# Patient Record
Sex: Male | Born: 1980 | Race: White | Hispanic: No | Marital: Single | Smoking: Never smoker
Health system: Southern US, Community
[De-identification: ages and names within clinical notes are randomized; demographics above are authoritative.]

## PROBLEM LIST (undated history)

## (undated) DIAGNOSIS — I881 Chronic lymphadenitis, except mesenteric: Secondary | ICD-10-CM

## (undated) DIAGNOSIS — F419 Anxiety disorder, unspecified: Secondary | ICD-10-CM

## (undated) DIAGNOSIS — K700 Alcoholic fatty liver: Secondary | ICD-10-CM

## (undated) DIAGNOSIS — E781 Pure hyperglyceridemia: Secondary | ICD-10-CM

## (undated) HISTORY — DX: Alcoholic fatty liver: K70.0

## (undated) HISTORY — DX: Anxiety disorder, unspecified: F41.9

## (undated) HISTORY — DX: Pure hyperglyceridemia: E78.1

## (undated) HISTORY — PX: VASECTOMY: SHX75

---
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
## 2015-03-24 ENCOUNTER — Encounter: Payer: Self-pay | Admitting: Family Medicine

## 2015-03-24 ENCOUNTER — Ambulatory Visit (INDEPENDENT_AMBULATORY_CARE_PROVIDER_SITE_OTHER): Payer: BLUE CROSS/BLUE SHIELD | Admitting: Family Medicine

## 2015-03-24 VITALS — BP 118/70 | HR 79 | Ht 77.0 in | Wt 226.0 lb

## 2015-03-24 DIAGNOSIS — F411 Generalized anxiety disorder: Secondary | ICD-10-CM

## 2015-03-24 DIAGNOSIS — M25562 Pain in left knee: Secondary | ICD-10-CM | POA: Diagnosis not present

## 2015-03-24 DIAGNOSIS — Z202 Contact with and (suspected) exposure to infections with a predominantly sexual mode of transmission: Secondary | ICD-10-CM

## 2015-03-24 MED ORDER — VENLAFAXINE HCL ER 75 MG PO CP24: ORAL_CAPSULE | ORAL | Status: DC

## 2015-03-24 NOTE — Progress Notes (Signed)
CC: Eduardo BlazerJoseph Parker is a 34 y.o. male is here for Establish Care   Subjective: HPI:  Very pleasant 34 year old here to establish care  Complains of left knee pain that is currently absent however if he is inactive and does not go to the gym most days a week he'll have left knee pain localized beneath the kneecap. It's worse with sitting for long periods of time. It's worse when descending stairs. Other than exercising nothing seems to make it better. Pain is mild in severity when present. He denies any mechanical symptoms. He denies swelling or redness of the knee  Complains of a sense of anxiety most days of the week. He describes it as a "universal" sense of worrying. He also expresses an element of depression however he has difficulty further defining it. Symptoms are mild in severity with interferon quality of life. No thoughts of wanting to harm self or others. He's been on Xanax in the past but no other medication for psychiatric issues. Symptoms have been present at least for 3 months now.  He has a history of unprotected sex with partners that could have had an STD. He denies any penile discharge or any other symptoms but he is worried that he could possibly have an STD and could spread to others.  Review of Systems - General ROS: negative for - chills, fever, night sweats, weight gain or weight loss Ophthalmic ROS: negative for - decreased vision ENT ROS: negative for - hearing change, nasal congestion, tinnitus or allergies Hematological and Lymphatic ROS: negative for - bleeding problems, bruising or swollen lymph nodes Breast ROS: negative Respiratory ROS: no cough, shortness of breath, or wheezing Cardiovascular ROS: no chest pain or dyspnea on exertion Gastrointestinal ROS: no abdominal pain, change in bowel habits, or black or bloody stools Genito-Urinary ROS: negative for - genital discharge, genital ulcers, incontinence or abnormal bleeding from genitals Musculoskeletal ROS:  negative for - joint pain or muscle pain other than that described above Neurological ROS: negative for - headaches or memory loss Dermatological ROS: negative for lumps, mole changes, rash and skin lesion changes  History reviewed. No pertinent past medical history.  History reviewed. No pertinent past surgical history. Family History  Problem Relation Age of Onset  . Breast cancer Mother   . Depression Mother   . Depression Father     Social History   Social History  . Marital Status: Single    Spouse Name: N/A  . Number of Children: N/A  . Years of Education: N/A   Occupational History  . Not on file.   Social History Main Topics  . Smoking status: Never Smoker   . Smokeless tobacco: Never Used  . Alcohol Use: Yes  . Drug Use: No  . Sexual Activity: Yes    Birth Control/ Protection: None   Other Topics Concern  . Not on file   Social History Narrative  . No narrative on file     Objective: BP 118/70 mmHg  Pulse 79  Ht 6\' 5"  (1.956 m)  Wt 226 lb (102.513 kg)  BMI 26.79 kg/m2  General: Alert and Oriented, No Acute Distress HEENT: Pupils equal, round, reactive to light. Conjunctivae clear.  External ears unremarkable, canals clear with intact TMs with appropriate landmarks.  Middle ear appears open without effusion. Pink inferior turbinates.  Moist mucous membranes, pharynx without inflammation nor lesions.  Neck supple without palpable lymphadenopathy nor abnormal masses. Lungs: Clear to auscultation bilaterally, no wheezing/ronchi/rales.  Comfortable work of breathing.  Good air movement. Cardiac: Regular rate and rhythm. Normal S1/S2.  No murmurs, rubs, nor gallops.   Extremities: No peripheral edema.  Strong peripheral pulses.  Mental Status: No depression, anxiety, nor agitation. Skin: Warm and dry.  Assessment & Plan: Neizan was seen today for establish care.  Diagnoses and all orders for this visit:  Generalized anxiety disorder -     venlafaxine XR  (EFFEXOR-XR) 75 MG 24 hr capsule; One by mouth daily, increase to two every morning after one week if tolerated.  Left knee pain  STD exposure -     HIV antibody -     RPR -     Hepatitis C antibody -     GC/Chlamydia Probe Amp   Anxiety: Start Effexor, follow-up in 4 weeks. Left knee pain: Given home exercises and rehabilitation plan to start for the next 3 weeks. STD exposure: Screening labs above, avoid unprotected sex pending results.  Return in about 4 weeks (around 04/21/2015) for CPE.

## 2015-03-25 LAB — RPR

## 2015-03-25 LAB — HIV ANTIBODY (ROUTINE TESTING W REFLEX): HIV 1&2 Ab, 4th Generation: NONREACTIVE

## 2015-03-25 LAB — GC/CHLAMYDIA PROBE AMP
CT Probe RNA: NEGATIVE
GC Probe RNA: NEGATIVE

## 2015-03-25 LAB — HEPATITIS C ANTIBODY: HCV Ab: NEGATIVE

## 2015-03-25 NOTE — Progress Notes (Signed)
Pt.notified

## 2015-04-28 ENCOUNTER — Ambulatory Visit (INDEPENDENT_AMBULATORY_CARE_PROVIDER_SITE_OTHER): Payer: BLUE CROSS/BLUE SHIELD | Admitting: Family Medicine

## 2015-04-28 ENCOUNTER — Encounter: Payer: Self-pay | Admitting: Family Medicine

## 2015-04-28 VITALS — BP 122/69 | HR 78 | Wt 225.0 lb

## 2015-04-28 DIAGNOSIS — Z0000 Encounter for general adult medical examination without abnormal findings: Secondary | ICD-10-CM | POA: Diagnosis not present

## 2015-04-28 DIAGNOSIS — F411 Generalized anxiety disorder: Secondary | ICD-10-CM

## 2015-04-28 DIAGNOSIS — R1909 Other intra-abdominal and pelvic swelling, mass and lump: Secondary | ICD-10-CM | POA: Diagnosis not present

## 2015-04-28 LAB — COMPLETE METABOLIC PANEL WITH GFR
ALBUMIN: 4.5 g/dL (ref 3.6–5.1)
ALK PHOS: 68 U/L (ref 40–115)
ALT: 22 U/L (ref 9–46)
AST: 22 U/L (ref 10–40)
BILIRUBIN TOTAL: 0.8 mg/dL (ref 0.2–1.2)
BUN: 12 mg/dL (ref 7–25)
CO2: 29 mmol/L (ref 20–31)
Calcium: 9.7 mg/dL (ref 8.6–10.3)
Chloride: 102 mmol/L (ref 98–110)
Creat: 1.13 mg/dL (ref 0.60–1.35)
GFR, Est African American: >89 mL/min (ref >=60)
GFR, Est Non African American: 83 mL/min (ref >=60)
GLUCOSE: 73 mg/dL (ref 65–99)
Potassium: 4 mmol/L (ref 3.5–5.3)
SODIUM: 138 mmol/L (ref 135–146)
TOTAL PROTEIN: 6.9 g/dL (ref 6.1–8.1)

## 2015-04-28 LAB — CBC
HCT: 49.2 % (ref 39.0–52.0)
HEMOGLOBIN: 16.9 g/dL (ref 13.0–17.0)
MCH: 30.3 pg (ref 26.0–34.0)
MCHC: 34.3 g/dL (ref 30.0–36.0)
MCV: 88.2 fL (ref 78.0–100.0)
MPV: 9.9 fL (ref 8.6–12.4)
PLATELETS: 241 10*3/uL (ref 150–400)
RBC: 5.58 MIL/uL (ref 4.22–5.81)
RDW: 12.9 % (ref 11.5–15.5)
WBC: 4.7 10*3/uL (ref 4.0–10.5)

## 2015-04-28 LAB — LIPID PANEL
Cholesterol: 152 mg/dL (ref 125–200)
HDL: 32 mg/dL — ABNORMAL LOW (ref >=40)
LDL Cholesterol: 72 mg/dL (ref <130)
Total CHOL/HDL Ratio: 4.8 Ratio (ref <=5.0)
Triglycerides: 241 mg/dL — ABNORMAL HIGH (ref <150)
VLDL: 48 mg/dL — ABNORMAL HIGH (ref <30)

## 2015-04-28 MED ORDER — VENLAFAXINE HCL ER 75 MG PO CP24: ORAL_CAPSULE | ORAL | Status: DC

## 2015-04-28 NOTE — Progress Notes (Signed)
CC: Laurine BlazerJoseph Parker is a 35 y.o. male is here for Annual Exam   Subjective: HPI:  Colonoscopy: No current indication Prostate: no current indication   Influenza Vaccine: declined today Pneumovax: no current indication Td/Tdap: Needs Tdap today Zoster: (Start 35 yo)  Requesting CPE with painless non changing mass in right groin noticed three weeks ago.  Nothing makes better or worse.  Review of Systems - General ROS: negative for - chills, fever, night sweats, weight gain or weight loss Ophthalmic ROS: negative for - decreased vision Psychological ROS: negative for - anxiety or depression ENT ROS: negative for - hearing change, nasal congestion, tinnitus or allergies Hematological and Lymphatic ROS: negative for - bleeding problems, bruising or swollen lymph nodes Breast ROS: negative Respiratory ROS: no cough, shortness of breath, or wheezing Cardiovascular ROS: no chest pain or dyspnea on exertion Gastrointestinal ROS: no abdominal pain, change in bowel habits, or black or bloody stools Genito-Urinary ROS: negative for - genital discharge, genital ulcers, incontinence or abnormal bleeding from genitals Musculoskeletal ROS: negative for - joint pain or muscle pain Neurological ROS: negative for - headaches or memory loss Dermatological ROS: negative for lumps, mole changes, rash and skin lesion changes History reviewed. No pertinent past medical history.  History reviewed. No pertinent past surgical history. Family History  Problem Relation Age of Onset  . Breast cancer Mother   . Depression Mother   . Depression Father     Social History   Social History  . Marital Status: Single    Spouse Name: N/A  . Number of Children: N/A  . Years of Education: N/A   Occupational History  . Not on file.   Social History Main Topics  . Smoking status: Never Smoker   . Smokeless tobacco: Never Used  . Alcohol Use: Yes  . Drug Use: No  . Sexual Activity: Yes    Birth Control/  Protection: None   Other Topics Concern  . Not on file   Social History Narrative     Objective: BP 122/69 mmHg  Pulse 78  Wt 225 lb (102.059 kg)  General: No Acute Distress HEENT: Atraumatic, normocephalic, conjunctivae normal without scleral icterus.  No nasal discharge, hearing grossly intact, TMs with good landmarks bilaterally with no middle ear abnormalities, posterior pharynx clear without oral lesions. Neck: Supple, trachea midline, no cervical nor supraclavicular adenopathy. Pulmonary: Clear to auscultation bilaterally without wheezing, rhonchi, nor rales. Cardiac: Regular rate and rhythm.  No murmurs, rubs, nor gallops. No peripheral edema.  2+ peripheral pulses bilaterally. Abdomen: Bowel sounds normal.  No masses.  Non-tender without rebound.  Negative Murphy's sign. GU: half centimeter diameter nontender lymph node in the right groin MSK: Grossly intact, no signs of weakness.  Full strength throughout upper and lower extremities.  Full ROM in upper and lower extremities.  No midline spinal tenderness. Neuro: Gait unremarkable, CN II-XII grossly intact.  C5-C6 Reflex 2/4 Bilaterally, L4 Reflex 2/4 Bilaterally.  Cerebellar function intact. Skin: No rashes. Psych: Alert and oriented to person/place/time.  Thought process normal. No anxiety/depression.  Assessment & Plan: Eduardo LongsJoseph was seen today for annual exam.  Diagnoses and all orders for this visit:  Generalized anxiety disorder -     venlafaxine XR (EFFEXOR-XR) 75 MG 24 hr capsule; One by mouth daily.  Annual physical exam -     Lipid panel -     COMPLETE METABOLIC PANEL WITH GFR -     CBC  Right groin mass   Healthy lifestyle interventions including  but not limited to regular exercise, a healthy low fat diet, moderation of salt intake, the dangers of tobacco/alcohol/recreational drug use, nutrition supplementation, and accident avoidance were discussed with the patient and a handout was provided for future  reference. He is pleased with 75 mg of Effexor XR daily, he was unable to go to 150 mg due to anorgasmia.  Discussed that we'll keep an eye on his right groin mass however I feel that this most likely a benign lymph node, encourage him to reach out to me as soon as possible. He develops unintentional weight loss, trouble finding infections, enlarging mass, pain in the right groin or night sweats.  Return in about 1 year (around 04/27/2016).

## 2015-04-28 NOTE — Patient Instructions (Signed)
Dr. Lynell Kussman's General Advice Following Your Complete Physical Exam  The Benefits of Regular Exercise: Unless you suffer from an uncontrolled cardiovascular condition, studies strongly suggest that regular exercise and physical activity will add to both the quality and length of your life.  The World Health Organization recommends 150 minutes of moderate intensity aerobic activity every week.  This is best split over 3-4 days a week, and can be as simple as a brisk walk for just over 35 minutes "most days of the week".  This type of exercise has been shown to lower LDL-Cholesterol, lower average blood sugars, lower blood pressure, lower cardiovascular disease risk, improve memory, and increase one's overall sense of wellbeing.  The addition of anaerobic (or "strength training") exercises offers additional benefits including but not limited to increased metabolism, prevention of osteoporosis, and improved overall cholesterol levels.  How Can I Strive For A Low-Fat Diet?: Current guidelines recommend that 25-35 percent of your daily energy (food) intake should come from fats.  One might ask how can this be achieved without having to dissect each meal on a daily basis?  Switch to skim or 1% milk instead of whole milk.  Focus on lean meats such as ground turkey, fresh fish, baked chicken, and lean cuts of beef as your source of dietary protein.  Limit saturated fat consumption to less than 10% of your daily caloric intake.  Limit trans fatty acid consumption primarily by limiting synthetic trans fats such as partially hydrogenated oils (Ex: fried fast foods).  Substitute olive or vegetable oil for solid fats where possible.  Moderation of Salt Intake: Provided you don't carry a diagnosis of congestive heart failure nor renal failure, I recommend a daily allowance of no more than 2300 mg of salt (sodium).  Keeping under this daily goal is associated with a decreased risk of cardiovascular events, creeping  above it can lead to elevated blood pressures and increases your risk of cardiovascular events.  Milligrams (mg) of salt is listed on all nutrition labels, and your daily intake can add up faster than you think.  Most canned and frozen dinners can pack in over half your daily salt allowance in one meal.    Lifestyle Health Risks: Certain lifestyle choices carry specific health risks.  As you may already know, tobacco use has been associated with increasing one's risk of cardiovascular disease, pulmonary disease, numerous cancers, among many other issues.  What you may not know is that there are medications and nicotine replacement strategies that can more than double your chances of successfully quitting.  I would be thrilled to help manage your quitting strategy if you currently use tobacco products.  When it comes to alcohol use, I've yet to find an "ideal" daily allowance.  Provided an individual does not have a medical condition that is exacerbated by alcohol consumption, general guidelines determine "safe drinking" as no more than two standard drinks for a man or no more than one standard drink for a male per day.  However, much debate still exists on whether any amount of alcohol consumption is technically "safe".  My general advice, keep alcohol consumption to a minimum for general health promotion.  If you or others believe that alcohol, tobacco, or recreational drug use is interfering with your life, I would be happy to provide confidential counseling regarding treatment options.  General "Over The Counter" Nutrition Advice: Postmenopausal women should aim for a daily calcium intake of 1200 mg, however a significant portion of this might already be   provided by diets including milk, yogurt, cheese, and other dairy products.  Vitamin D has been shown to help preserve bone density, prevent fatigue, and has even been shown to help reduce falls in the elderly.  Ensuring a daily intake of 800 Units of  Vitamin D is a good place to start to enjoy the above benefits, we can easily check your Vitamin D level to see if you'd potentially benefit from supplementation beyond 800 Units a day.  Folic Acid intake should be of particular concern to women of childbearing age.  Daily consumption of 400-800 mcg of Folic Acid is recommended to minimize the chance of spinal cord defects in a fetus should pregnancy occur.    For many adults, accidents still remain one of the most common culprits when it comes to cause of death.  Some of the simplest but most effective preventitive habits you can adopt include regular seatbelt use, proper helmet use, securing firearms, and regularly testing your smoke and carbon monoxide detectors.  Reola Buckles B. Donnamarie Shankles DO [facility] [address], [address] [place], [place] [address] Phone: [phone]  

## 2015-04-29 ENCOUNTER — Telehealth: Payer: Self-pay | Admitting: Family Medicine

## 2015-04-29 DIAGNOSIS — E781 Pure hyperglyceridemia: Secondary | ICD-10-CM | POA: Insufficient documentation

## 2015-04-29 HISTORY — DX: Pure hyperglyceridemia: E78.1

## 2015-04-29 MED ORDER — FISH OIL 1000 MG PO CAPS: ORAL_CAPSULE | ORAL | Status: DC

## 2015-04-29 NOTE — Telephone Encounter (Signed)
Will you please let patient know that his blood sugar, kidney function, liver function, and blood cell counts were all normal.  His LDL cholesterol was normal but his triglycerides were elevated to a degree that can cause liver or pancreatic inflammation over time.  This can be improved with starting on a twice a day OTC fish oil capsule at a dose of 1000mg .  I'd recommend a routine physical in one year.

## 2015-04-29 NOTE — Telephone Encounter (Signed)
Pt advised.

## 2015-11-04 ENCOUNTER — Encounter: Payer: Self-pay | Admitting: Family Medicine

## 2015-11-04 ENCOUNTER — Ambulatory Visit (INDEPENDENT_AMBULATORY_CARE_PROVIDER_SITE_OTHER): Payer: BLUE CROSS/BLUE SHIELD | Admitting: Family Medicine

## 2015-11-04 VITALS — BP 126/77 | HR 71 | Wt 245.0 lb

## 2015-11-04 DIAGNOSIS — K2100 Gastro-esophageal reflux disease with esophagitis, without bleeding: Secondary | ICD-10-CM

## 2015-11-04 DIAGNOSIS — F411 Generalized anxiety disorder: Secondary | ICD-10-CM | POA: Diagnosis not present

## 2015-11-04 MED ORDER — VENLAFAXINE HCL ER 75 MG PO CP24: ORAL_CAPSULE | ORAL | Status: DC

## 2015-11-04 MED ORDER — PANTOPRAZOLE SODIUM 40 MG PO TBEC: 40.0000 mg | DELAYED_RELEASE_TABLET | Freq: Every day | ORAL | Status: DC

## 2015-11-04 NOTE — Progress Notes (Signed)
CC: Eduardo Parker is a 35 y.o. male is here for Anxiety; Shortness of Breath; and Medication Refill   Subjective: HPI:  Follow-up anxiety and irritability: He's still very pleased with Effexor. He is taking 75 mg a and even others have noticed is not as irritable over simple things. He is not anxious about much but when he is to be about anything. 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 He tells me on average 6 days out of of the week he feels laid back and comfortable when he comes home but there will always be about 1 day a week that he is unable to calm down and feels nervous to a mild degree for other reason. It always seems to happen in the evening. He has no difficulty falling asleep. He denies any paranoia or depression.  His major complaint today is a sensation of not being able to complete breath and occasional regurgitation if he eats solids quickly. It never happens with liquids. It's been going on a couple days out of the week for the past 2 or 3 weeks. He's never had this before. No interventions as of yet. He denies any epigastric discomfort or choking.   Review Of Systems Outlined In HPI  No past medical history on file.  No past surgical history on file. Family History  Problem Relation Age of Onset  . Breast cancer Mother   . Depression Mother   . Depression Father     Social History   Social History  . Marital Status: Single    Spouse Name: N/A  . Number of Children: N/A  . Years of Education: N/A   Occupational History  . Not on file.   Social History Main Topics  . Smoking status: Never Smoker   . Smokeless tobacco: Never Used  . Alcohol Use: Yes  . Drug Use: No  . Sexual Activity: Yes    Birth Control/ Protection: None   Other Topics Concern  . Not on file   Social History Narrative     Objective: BP 126/77 mmHg  Pulse 71  Wt 245 lb (111.131 kg)  General: Alert and Oriented, No Acute Distress HEENT: Pupils equal, round, reactive to light.  Conjunctivae clear. Moist mucous membranes are unremarkable Lungs: Clear to auscultation bilaterally, no wheezing/ronchi/rales.  Comfortable work of breathing. Good air movement. Cardiac: Regular rate and rhythm. Normal S1/S2.  No murmurs, rubs, nor gallops.   Extremities: No peripheral edema.  Strong peripheral pulses.  Mental Status: No depression, anxiety, nor agitation. Skin: Warm and dry.  Assessment & Plan: Nyzir was seen today for anxiety, shortness of breath and medication refill.  Diagnoses and all orders for this visit:  Generalized anxiety disorder -     venlafaxine XR (EFFEXOR-XR) 75 MG 24 hr capsule; One by mouth daily.  Gastroesophageal reflux disease with esophagitis -     pantoprazole (PROTONIX) 40 MG tablet; Take 1 tablet (40 mg total) by mouth daily.  Denies anxiety disorder: Overall controlled, since symptoms are worse in the evening of encouraged him to start taking his Effexor dose in the evening. If this does not help after one week please call me and I'll be happy to him a when necessary dose of Xanax which he had in the past. Sounds like solid food is getting stuck in his esophagus therefore start Protonix for the next month and if no benefit or worsen over the next month next step would be a barium swallow study.  Return in about 6 months (  around 05/06/2016).

## 2015-11-16 ENCOUNTER — Telehealth: Payer: Self-pay

## 2015-11-16 MED ORDER — ALPRAZOLAM 0.25 MG PO TABS: 0.2500 mg | ORAL_TABLET | Freq: Two times a day (BID) | ORAL | 1 refills | Status: DC | PRN

## 2015-11-16 NOTE — Telephone Encounter (Signed)
[place], Rx placed in in-box ready for pickup/faxing. Xanax to take in addition to effexor on days that anxiety is not controlled.

## 2015-11-16 NOTE — Telephone Encounter (Signed)
vm is not set up  

## 2015-12-22 ENCOUNTER — Other Ambulatory Visit: Payer: Self-pay | Admitting: Family Medicine

## 2015-12-22 DIAGNOSIS — K2100 Gastro-esophageal reflux disease with esophagitis, without bleeding: Secondary | ICD-10-CM

## 2016-01-07 DIAGNOSIS — J0100 Acute maxillary sinusitis, unspecified: Secondary | ICD-10-CM | POA: Diagnosis not present

## 2016-01-21 ENCOUNTER — Other Ambulatory Visit: Payer: Self-pay | Admitting: *Deleted

## 2016-01-21 DIAGNOSIS — K2100 Gastro-esophageal reflux disease with esophagitis, without bleeding: Secondary | ICD-10-CM

## 2016-01-21 MED ORDER — PANTOPRAZOLE SODIUM 40 MG PO TBEC: 40.0000 mg | DELAYED_RELEASE_TABLET | Freq: Every day | ORAL | 0 refills | Status: DC

## 2016-01-21 NOTE — Progress Notes (Signed)
Refill request for 90 day supply sent.

## 2016-04-20 ENCOUNTER — Other Ambulatory Visit: Payer: Self-pay | Admitting: Osteopathic Medicine

## 2016-04-20 DIAGNOSIS — K2100 Gastro-esophageal reflux disease with esophagitis, without bleeding: Secondary | ICD-10-CM

## 2016-05-26 ENCOUNTER — Other Ambulatory Visit: Payer: Self-pay

## 2016-05-26 DIAGNOSIS — K2100 Gastro-esophageal reflux disease with esophagitis, without bleeding: Secondary | ICD-10-CM

## 2016-05-26 MED ORDER — PANTOPRAZOLE SODIUM 40 MG PO TBEC: 40.0000 mg | DELAYED_RELEASE_TABLET | Freq: Every day | ORAL | 0 refills | Status: DC

## 2016-05-26 NOTE — Telephone Encounter (Signed)
Patient request refill for Pantoprazole 40 mg.  #15 [phone] R has been sent to pharmacy. This is the 2nd request and patient was informed both times that an office visit is needed. Eduardo Parker,CMA

## 2016-05-27 ENCOUNTER — Other Ambulatory Visit: Payer: Self-pay | Admitting: Osteopathic Medicine

## 2016-05-27 DIAGNOSIS — K2100 Gastro-esophageal reflux disease with esophagitis, without bleeding: Secondary | ICD-10-CM

## 2016-07-14 ENCOUNTER — Other Ambulatory Visit: Payer: Self-pay | Admitting: Osteopathic Medicine

## 2016-07-14 DIAGNOSIS — F411 Generalized anxiety disorder: Secondary | ICD-10-CM

## 2016-07-14 MED ORDER — VENLAFAXINE HCL ER 75 MG PO CP24: ORAL_CAPSULE | ORAL | 0 refills | Status: DC

## 2016-07-19 DIAGNOSIS — S0121XA Laceration without foreign body of nose, initial encounter: Secondary | ICD-10-CM | POA: Diagnosis not present

## 2016-07-19 DIAGNOSIS — Y9361 Activity, american tackle football: Secondary | ICD-10-CM | POA: Diagnosis not present

## 2016-07-19 DIAGNOSIS — W500XXA Accidental hit or strike by another person, initial encounter: Secondary | ICD-10-CM | POA: Diagnosis not present

## 2016-07-19 DIAGNOSIS — R040 Epistaxis: Secondary | ICD-10-CM | POA: Diagnosis not present

## 2016-07-21 DIAGNOSIS — S022XXA Fracture of nasal bones, initial encounter for closed fracture: Secondary | ICD-10-CM | POA: Diagnosis not present

## 2016-08-02 DIAGNOSIS — S022XXD Fracture of nasal bones, subsequent encounter for fracture with routine healing: Secondary | ICD-10-CM | POA: Diagnosis not present

## 2016-08-02 DIAGNOSIS — J342 Deviated nasal septum: Secondary | ICD-10-CM | POA: Diagnosis not present

## 2016-08-14 ENCOUNTER — Other Ambulatory Visit: Payer: Self-pay | Admitting: Osteopathic Medicine

## 2016-08-14 DIAGNOSIS — F411 Generalized anxiety disorder: Secondary | ICD-10-CM

## 2016-08-22 ENCOUNTER — Ambulatory Visit (INDEPENDENT_AMBULATORY_CARE_PROVIDER_SITE_OTHER): Payer: BLUE CROSS/BLUE SHIELD | Admitting: Physician Assistant

## 2016-08-22 ENCOUNTER — Encounter: Payer: Self-pay | Admitting: Physician Assistant

## 2016-08-22 VITALS — BP 135/85 | HR 80 | Wt 278.0 lb

## 2016-08-22 DIAGNOSIS — R6882 Decreased libido: Secondary | ICD-10-CM | POA: Insufficient documentation

## 2016-08-22 DIAGNOSIS — R740 Nonspecific elevation of levels of transaminase and lactic acid dehydrogenase [LDH]: Secondary | ICD-10-CM

## 2016-08-22 DIAGNOSIS — F411 Generalized anxiety disorder: Secondary | ICD-10-CM

## 2016-08-22 DIAGNOSIS — Z7689 Persons encountering health services in other specified circumstances: Secondary | ICD-10-CM | POA: Diagnosis not present

## 2016-08-22 DIAGNOSIS — R7401 Elevation of levels of liver transaminase levels: Secondary | ICD-10-CM

## 2016-08-22 DIAGNOSIS — Z113 Encounter for screening for infections with a predominantly sexual mode of transmission: Secondary | ICD-10-CM | POA: Diagnosis not present

## 2016-08-22 LAB — CBC
HEMATOCRIT: 50.4 % — AB (ref 38.5–50.0)
Hemoglobin: 17 g/dL (ref 13.2–17.1)
MCH: 31.5 pg (ref 27.0–33.0)
MCHC: 33.7 g/dL (ref 32.0–36.0)
MCV: 93.5 fL (ref 80.0–100.0)
MPV: 10.5 fL (ref 7.5–12.5)
PLATELETS: 225 10*3/uL (ref 140–400)
RBC: 5.39 MIL/uL (ref 4.20–5.80)
RDW: 13.9 % (ref 11.0–15.0)
WBC: 5 10*3/uL (ref 3.8–10.8)

## 2016-08-22 LAB — COMPREHENSIVE METABOLIC PANEL
ALT: 63 U/L — ABNORMAL HIGH (ref 9–46)
AST: 36 U/L (ref 10–40)
Albumin: 4.3 g/dL (ref 3.6–5.1)
Alkaline Phosphatase: 81 U/L (ref 40–115)
BUN: 12 mg/dL (ref 7–25)
CALCIUM: 9.7 mg/dL (ref 8.6–10.3)
CHLORIDE: 104 mmol/L (ref 98–110)
CO2: 18 mmol/L — AB (ref 20–31)
Creat: 1.18 mg/dL (ref 0.60–1.35)
GLUCOSE: 104 mg/dL — AB (ref 65–99)
POTASSIUM: 4.2 mmol/L (ref 3.5–5.3)
Sodium: 141 mmol/L (ref 135–146)
Total Bilirubin: 0.6 mg/dL (ref 0.2–1.2)
Total Protein: 7.4 g/dL (ref 6.1–8.1)

## 2016-08-22 LAB — LIPID PANEL W/REFLEX DIRECT LDL
CHOL/HDL RATIO: 6.2 ratio — AB (ref <5.0)
Cholesterol: 179 mg/dL (ref <200)
HDL: 29 mg/dL — ABNORMAL LOW (ref >40)
LDL-CHOLESTEROL: 98 mg/dL
NON-HDL CHOLESTEROL (CALC): 150 mg/dL — AB (ref <130)
Triglycerides: 390 mg/dL — ABNORMAL HIGH (ref <150)

## 2016-08-22 MED ORDER — VENLAFAXINE HCL ER 75 MG PO CP24: 75.0000 mg | ORAL_CAPSULE | Freq: Every day | ORAL | 2 refills | Status: DC

## 2016-08-22 MED ORDER — ALPRAZOLAM 0.5 MG PO TABS: 0.5000 mg | ORAL_TABLET | Freq: Every day | ORAL | 2 refills | Status: DC | PRN

## 2016-08-22 NOTE — Progress Notes (Signed)
HPI:                                                                Eduardo Parker is a 36 y.o. male who presents to [facility] today to establish care   Current Concerns include testosterone and refills  Depression/Anxiety: taking Effexor  daily without difficulty. He also is taking Xanax 0.25mg  prn. He states that he usually needs to take 2 pills to have any effect. Denies symptoms of mania/hypomania. Denies suicidal thinking. Denies auditory/visual hallucinations.  Patient is also wondering if he could have his testosterone levels checked. He states he has heard about low T on the radio and thinks he has some of the symptoms. He mainly endorses low libido and fatigue. He denies decreased strength/endurance, loss of height, falling asleep after dinner, or erectile dysfunction.  Patient would also like to be screened for STI's. He denies any symptoms, including penile discharge or lesions. He is currently sexually active with 1 male partner and does not use condoms.  Health Maintenance Health Maintenance  Topic Date Due  . TETANUS/TDAP  11/12/1997  . INFLUENZA VACCINE  11/23/2016  . HIV Screening  Completed    Past Medical History:  Diagnosis Date  . Anxiety    No past surgical history on file. Social History  Substance Use Topics  . Smoking status: Never Smoker  . Smokeless tobacco: Never Used  . Alcohol use Yes   family history includes Breast cancer in his mother; Depression in his father and mother.  ROS: negative except as noted in the HPI  Medications: Current Outpatient Prescriptions  Medication Sig Dispense Refill  . Omega-3 Fatty Acids (FISH OIL) 1000 MG CAPS One by mouth BID  0  . pantoprazole (PROTONIX) 40 MG tablet Take 1 tablet (40 mg total) by mouth daily. WILL NOT REFILL WITHOUT AN APPOINTMENT 15 tablet 0  . venlafaxine XR (EFFEXOR-XR) 75 MG 24 hr capsule Take 1 capsule (75 mg total) by mouth daily with  breakfast. 90 capsule 2  . ALPRAZolam (XANAX) 0.5 MG tablet Take 1 tablet (0.5 mg total) by mouth daily as needed for anxiety. 30 tablet 2   No current facility-administered medications for this visit.    Allergies  Allergen Reactions  . Penicillins     childhood       Objective:  BP 135/85   Pulse 80   Wt 278 lb (126.1 kg)   BMI 32.97 kg/m  Gen: well-groomed, cooperative, not ill-appearing, no distress HEENT: normal conjunctiva, neck supple, no thyromegaly or tenderness Pulm: Normal work of breathing, normal phonation, clear to auscultation bilaterally CV: Normal rate, regular rhythm, s1 and s2 distinct, no murmurs, clicks or rubs, no carotid bruit Neuro: alert and oriented x 3, EOM's intact, PERRLA, normal tone, no tremor MSK: moving all extremities, normal gait and station, no peripheral edema Skin: warm and dry, no rashes or lesions on exposed skin Psych: anxious affect, euthymic mood, normal speech and thought content    Assessment and Plan: 36 y.o. male with   1. Low libido - Testosterone Total,Free,Bio, Males  2. Encounter to establish care - CBC - Comprehensive metabolic panel - Lipid Panel w/reflex Direct LDL  3. Generalized anxiety disorder - discussed that his effexor  dose is relatively low and this should be increased before increasing frequency and dosing of xanax to better manage GAD - discussed that xanax has tolerance, dependence and addiction properties and should be limited to severe breakthrough anxiety and panic attacks - venlafaxine XR (EFFEXOR-XR) 75 MG 24 hr capsule; Take 1 capsule (75 mg total) by mouth daily with breakfast.  Dispense: 90 capsule; Refill: 2 - ALPRAZolam (XANAX) 0.5 MG tablet; Take 1 tablet (0.5 mg total) by mouth daily as needed for anxiety.  Dispense: 30 tablet; Refill: 2  4. Screening examination for STD (sexually transmitted disease) - RPR - Hepatitis C antibody - HIV antibody - GC/Chlamydia Probe Amp  5. Elevated ALT  measurement - Comprehensive metabolic panel; Future  Patient education and anticipatory guidance given Patient agrees with treatment plan Follow-up in 6 months for CPE or sooner as needed  Levonne Hubert PA-C

## 2016-08-22 NOTE — Patient Instructions (Addendum)
- Limit Xanax to breakthrough anxiety/panic attacks. Do not use daily. Do not combine with alcohol or other sedating medications.  - If you are finding that you are needing Xanax more than a few days per week, increase your Effexor to  daily - Follow-up in 6 months for annual physical exam   Generalized Anxiety Disorder, Adult Generalized anxiety disorder (GAD) is a mental health disorder. People with this condition constantly worry about everyday events. Unlike normal anxiety, worry related to GAD is not triggered by a specific event. These worries also do not fade or get better with time. GAD interferes with life functions, including relationships, work, and school. GAD can vary from mild to severe. People with severe GAD can have intense waves of anxiety with physical symptoms (panic attacks). What are the causes? The exact cause of GAD is not known. What increases the risk? This condition is more likely to develop in:  Women.  People who have a family history of anxiety disorders.  People who are very shy.  People who experience very stressful life events, such as the death of a loved one.  People who have a very stressful family environment. What are the signs or symptoms? People with GAD often worry excessively about many things in their lives, such as their health and family. They may also be overly concerned about:  Doing well at work.  Being on time.  Natural disasters.  Friendships. Physical symptoms of GAD include:  Fatigue.  Muscle tension or having muscle twitches.  Trembling or feeling shaky.  Being easily startled.  Feeling like your heart is pounding or racing.  Feeling out of breath or like you cannot take a deep breath.  Having trouble falling asleep or staying asleep.  Sweating.  Nausea, diarrhea, or irritable bowel syndrome (IBS).  Headaches.  Trouble concentrating or remembering facts.  Restlessness.  Irritability. How is this  diagnosed? Your health care provider can diagnose GAD based on your symptoms and medical history. You will also have a physical exam. The health care provider will ask specific questions about your symptoms, including how severe they are, when they started, and if they come and go. Your health care provider may ask you about your use of alcohol or drugs, including prescription medicines. Your health care provider may refer you to a mental health specialist for further evaluation. Your health care provider will do a thorough examination and may perform additional tests to rule out other possible causes of your symptoms. To be diagnosed with GAD, a person must have anxiety that:  Is out of his or her control.  Affects several different aspects of his or her life, such as work and relationships.  Causes distress that makes him or her unable to take part in normal activities.  Includes at least three physical symptoms of GAD, such as restlessness, fatigue, trouble concentrating, irritability, muscle tension, or sleep problems. Before your health care provider can confirm a diagnosis of GAD, these symptoms must be present more days than they are not, and they must last for six months or longer. How is this treated? The following therapies are usually used to treat GAD:  Medicine. Antidepressant medicine is usually prescribed for long-term daily control. Antianxiety medicines may be added in severe cases, especially when panic attacks occur.  Talk therapy (psychotherapy). Certain types of talk therapy can be helpful in treating GAD by providing support, education, and guidance. Options include:  Cognitive behavioral therapy (CBT). People learn coping skills and techniques to  ease their anxiety. They learn to identify unrealistic or negative thoughts and behaviors and to replace them with positive ones.  Acceptance and commitment therapy (ACT). This treatment teaches people how to be mindful as a way  to cope with unwanted thoughts and feelings.  Biofeedback. This process trains you to manage your body's response (physiological response) through breathing techniques and relaxation methods. You will work with a therapist while machines are used to monitor your physical symptoms.  Stress management techniques. These include yoga, meditation, and exercise. A mental health specialist can help determine which treatment is best for you. Some people see improvement with one type of therapy. However, other people require a combination of therapies. Follow these instructions at home:  Take over-the-counter and prescription medicines only as told by your health care provider.  Try to maintain a normal routine.  Try to anticipate stressful situations and allow extra time to manage them.  Practice any stress management or self-calming techniques as taught by your health care provider.  Do not punish yourself for setbacks or for not making progress.  Try to recognize your accomplishments, even if they are small.  Keep all follow-up visits as told by your health care provider. This is important. Contact a health care provider if:  Your symptoms do not get better.  Your symptoms get worse.  You have signs of depression, such as:  A persistently sad, cranky, or irritable mood.  Loss of enjoyment in activities that used to bring you joy.  Change in weight or eating.  Changes in sleeping habits.  Avoiding friends or family members.  Loss of energy for normal tasks.  Feelings of guilt or worthlessness. Get help right away if:  You have serious thoughts about hurting yourself or others. If you ever feel like you may hurt yourself or others, or have thoughts about taking your own life, get help right away. You can go to your nearest emergency department or call:  Your local emergency services (911 in the [place]  A suicide crisis helpline, such as the National Suicide Prevention  Lifeline at 605-850-6020. This is open 24 hours a day. Summary  Generalized anxiety disorder (GAD) is a mental health disorder that involves worry that is not triggered by a specific event.  People with GAD often worry excessively about many things in their lives, such as their health and family.  GAD may cause physical symptoms such as restlessness, trouble concentrating, sleep problems, frequent sweating, nausea, diarrhea, headaches, and trembling or muscle twitching.  A mental health specialist can help determine which treatment is best for you. Some people see improvement with one type of therapy. However, other people require a combination of therapies. This information is not intended to replace advice given to you by your health care provider. Make sure you discuss any questions you have with your health care provider. Document Released: 08/06/2012 Document Revised: 03/01/2016 Document Reviewed: 03/01/2016 Elsevier Interactive Patient Education  2017 [employer].

## 2016-08-23 DIAGNOSIS — R7401 Elevation of levels of liver transaminase levels: Secondary | ICD-10-CM | POA: Insufficient documentation

## 2016-08-23 DIAGNOSIS — R740 Nonspecific elevation of levels of transaminase and lactic acid dehydrogenase [LDH]: Secondary | ICD-10-CM

## 2016-08-23 LAB — HIV ANTIBODY (ROUTINE TESTING W REFLEX): HIV: NONREACTIVE

## 2016-08-23 LAB — TESTOSTERONE TOTAL,FREE,BIO, MALES
ALBUMIN: 4.3 g/dL (ref 3.6–5.1)
SEX HORMONE BINDING: 26 nmol/L (ref 10–50)
TESTOSTERONE FREE: 63.9 pg/mL (ref 46.0–224.0)
TESTOSTERONE: 398 ng/dL (ref 250–827)
Testosterone, Bioavailable: 125.9 ng/dL (ref 110.0–575.0)

## 2016-08-23 LAB — HEPATITIS C ANTIBODY: HCV AB: NEGATIVE

## 2016-08-23 LAB — GC/CHLAMYDIA PROBE AMP
CT Probe RNA: NOT DETECTED
GC PROBE AMP APTIMA: NOT DETECTED

## 2016-08-23 LAB — RPR

## 2016-08-25 ENCOUNTER — Encounter: Payer: Self-pay | Admitting: Physician Assistant

## 2016-11-14 ENCOUNTER — Other Ambulatory Visit: Payer: Self-pay | Admitting: Osteopathic Medicine

## 2016-11-14 DIAGNOSIS — K2100 Gastro-esophageal reflux disease with esophagitis, without bleeding: Secondary | ICD-10-CM

## 2016-11-14 NOTE — Telephone Encounter (Signed)
Reducing dose to 20mg  since indication is GERD Need for PPI should be re-assessed

## 2016-11-15 NOTE — Telephone Encounter (Signed)
Unable to leave a message, voicemail has not been set up.

## 2017-05-08 ENCOUNTER — Other Ambulatory Visit: Payer: Self-pay

## 2017-05-08 DIAGNOSIS — K2100 Gastro-esophageal reflux disease with esophagitis, without bleeding: Secondary | ICD-10-CM

## 2017-05-08 MED ORDER — PANTOPRAZOLE SODIUM 20 MG PO TBEC: 20.0000 mg | DELAYED_RELEASE_TABLET | Freq: Every day | ORAL | 0 refills | Status: DC

## 2017-05-16 ENCOUNTER — Other Ambulatory Visit: Payer: Self-pay | Admitting: Physician Assistant

## 2017-05-16 DIAGNOSIS — F411 Generalized anxiety disorder: Secondary | ICD-10-CM

## 2017-05-17 ENCOUNTER — Ambulatory Visit (INDEPENDENT_AMBULATORY_CARE_PROVIDER_SITE_OTHER): Payer: BLUE CROSS/BLUE SHIELD | Admitting: Physician Assistant

## 2017-05-17 ENCOUNTER — Encounter: Payer: Self-pay | Admitting: Physician Assistant

## 2017-05-17 VITALS — BP 131/84 | HR 72 | Temp 97.9°F | Wt 279.0 lb

## 2017-05-17 DIAGNOSIS — R21 Rash and other nonspecific skin eruption: Secondary | ICD-10-CM | POA: Diagnosis not present

## 2017-05-17 DIAGNOSIS — R7401 Elevation of levels of liver transaminase levels: Secondary | ICD-10-CM

## 2017-05-17 DIAGNOSIS — F411 Generalized anxiety disorder: Secondary | ICD-10-CM

## 2017-05-17 DIAGNOSIS — Z1321 Encounter for screening for nutritional disorder: Secondary | ICD-10-CM

## 2017-05-17 DIAGNOSIS — Z789 Other specified health status: Secondary | ICD-10-CM

## 2017-05-17 DIAGNOSIS — F341 Dysthymic disorder: Secondary | ICD-10-CM

## 2017-05-17 DIAGNOSIS — J342 Deviated nasal septum: Secondary | ICD-10-CM

## 2017-05-17 DIAGNOSIS — F1090 Alcohol use, unspecified, uncomplicated: Secondary | ICD-10-CM

## 2017-05-17 DIAGNOSIS — Z9109 Other allergy status, other than to drugs and biological substances: Secondary | ICD-10-CM | POA: Diagnosis not present

## 2017-05-17 DIAGNOSIS — R030 Elevated blood-pressure reading, without diagnosis of hypertension: Secondary | ICD-10-CM

## 2017-05-17 DIAGNOSIS — R740 Nonspecific elevation of levels of transaminase and lactic acid dehydrogenase [LDH]: Secondary | ICD-10-CM

## 2017-05-17 MED ORDER — CETIRIZINE HCL 10 MG PO TABS
10.0000 mg | ORAL_TABLET | Freq: Every day | ORAL | 11 refills | Status: DC
Start: 2017-05-17 — End: 2018-02-12

## 2017-05-17 MED ORDER — VENLAFAXINE HCL ER 75 MG PO CP24: 150.0000 mg | ORAL_CAPSULE | Freq: Every day | ORAL | 5 refills | Status: DC

## 2017-05-17 MED ORDER — FLUTICASONE PROPIONATE 50 MCG/ACT NA SUSP: 1.0000 | Freq: Every day | NASAL | 3 refills | Status: DC | PRN

## 2017-05-17 NOTE — Patient Instructions (Addendum)
- Cetirizine daily for hives/allergies - Flonase 1 spray each nostril daily for deviated septum/nasal congestion - Biotene Dry Mouth Oral Rinse - Humidified air - Slowly cut back on drinking by a drink per week to a goal of 2 standard drinks per day   What You Need To Know About Alcohol Abuse and Dependence, Adult Alcohol is a widely available drug. People who use alcohol will consume it in varying amounts. People who drink alcohol in excess, and have behavior problems during and after drinking alcohol, may have what is called an alcohol use disorder. Alcohol abuse and alcohol dependence are the two main types of alcohol use disorders:  Alcohol abuse is when you use alcohol too much or too often. You may use alcohol to make yourself feel happy or to reduce stress, but you may have a hard time setting a limit on the amount you drink.  Alcohol dependence is when you use alcohol excessively for a period of time, and your body and brain chemistry changes as a result. This can make it hard to stop drinking because you may start to feel sick or feel different when you do not use alcohol.  How can alcohol abuse and dependence affect me? Alcohol abuse and dependence can have a negative effect on your life. Excessive use of alcohol may lead to an addiction. You may feel like you need alcohol to function normally. You may drink alcohol before work in the morning, during the day, or as soon as you get home from work in the evening. These actions can result in:  Poor performance at work.  Losing your job.  Financial problems.  Car crashes or criminal charges from driving after drinking alcohol.  Problems in your relationships with friends and family.  Losing the trust and respect of co-workers, friends, and family.  Drinking heavily over a long period of time can permanently damage your body and brain, and can cause lifelong health issues, such as:  Liver disease.  Heart problems, high blood  pressure, or stroke.  Damage to your pancreas.  Certain cancers.  Decreased ability to fight infections.  Numbness or tingling in hands or feet (neuropathy).  Brain damage.  Depression.  Early (premature) death.  When your body craves alcohol, it is easy to drink more than your body can handle. As a result, you may overdose. Alcohol overdose is a serious situation that requires hospitalization. It may lead to permanent injuries or death. What are the benefits of avoiding alcohol use? Limiting or avoiding alcohol can help you:  Avoid risks to your body, brain, and relationships.  Avoid the risk of abusing or becoming dependent on alcohol.  Keep your mind and body healthy. As a result, you may be more likely to accomplish your life goals.  Avoid permanent injury, organ damage, or death due to alcohol use.  What steps can I take to stop drinking?  The best way to avoid alcohol abuse, dependence, and addiction is not to drink at all, or to drink measured amounts. Measured drinking means no more than 1 drink a day for nonpregnant women and 2 drinks a day for men. One drink equals 12 oz of beer, 5 oz of wine, or 1 oz of hard liquor.  Stop drinking if you have been drinking too much. This can be very hard to do if you are used to abusing alcohol. If you find it hard to stop drinking, talk about your experience with someone you trust. This person may be  able to help you change your drinking behavior.  Instead of drinking alcohol, do something else, like a hobby or exercise.  Find healthy ways to cope with stress, such as exercise, meditation, or spending time with people you care about.  In social gatherings and places where there may be alcohol, make intentional choices to drink non-alcohol beverages.  If your family, co-workers, or friends drink, talk to them about supporting you in your efforts to stop drinking. Ask them not to drink around you. Spend more time with people who do  not drink alcohol.  If you think that you have an alcohol dependency problem: ? Tell friends or family about your concerns. ? Talk with your health care provider or another health professional about where to get help. ? Work with a Paramedic and a Network engineer. ? Consider joining a support group for people who struggle with alcohol abuse, dependence, and addiction. Where to find support: You can get support for preventing alcohol abuse, dependence, and addiction from:  Your health care provider.  Alcoholics Anonymous (AA): SalaryStart.tn  SMART Recovery: www.smartrecovery.org  Local treatment centers or chemical dependency counselors.  Where to find more information: Learn more about alcohol abuse and dependence from:  Centers for Disease Control and Prevention: GreenTraditions.fi  [employer] on Alcohol Abuse and Alcoholism: CyberComps.hu  Local AA groups in your community.  Contact a health care provider if:  You drink more or for longer than you intended, on more than one occasion.  You tried to stop drinking or to cut back on how much you drink, but you were not able to.  You often drink to the point of vomiting or passing out.  You want to drink so badly that you cannot think about anything else.  Drinking has created problems in your life, but you continue to drink.  You keep drinking even though you feel anxious, depressed, or have experienced memory loss.  You have stopped doing the things you used to enjoy in order to drink.  You have to drink more than you used to in order to get the effect you want.  You experience anxiety, sweating, nausea, shakiness, and trouble sleeping when you try to stop drinking.  You have thoughts about hurting yourself or others. If you ever feel like you may hurt yourself or others, or have thoughts about taking your own life, get help  right away. You can go to your nearest emergency department or call:  Your local emergency services (911 in the [place]  A suicide crisis helpline, such as the National Suicide Prevention Lifeline at 726-443-0119. This is open 24 hours a day.  Summary  Alcohol is a widely available drug. Misusing, abusing, and becoming dependent on alcohol can cause many problems.  It is important to measure and limit the amount of alcohol you consume. It is recommended to limit alcohol use to 1 drink a day for nonpregnant women and 2 drinks a day for men.  The risks associated with drinking too much will have a direct negative impact on your work, relationships, and health.  If you realize that you are having some challenges keeping your drinking under control, find some ways to change your behavior. Hobbies, self calming activities, exercise, or support groups can help.  If you feel you need help with changing your drinking habits, talk with your health care provider, a good friend, or a therapist, or go to an AA group. This information is not intended to replace advice given  to you by your health care provider. Make sure you discuss any questions you have with your health care provider. Document Released: 04/05/2016 Document Revised: 04/05/2016 Document Reviewed: 04/05/2016 Elsevier Interactive Patient Education  2018 [employer]   Hives Hives (urticaria) are itchy, red, swollen areas on your skin. Hives can appear on any part of your body and can vary in size. They can be as small as the tip of a pen or much larger. Hives often fade within 24 hours (acute hives). In other cases, new hives appear after old ones fade. This cycle can continue for several days or weeks (chronic hives). Hives result from your body's reaction to an irritant or to something that you are allergic to (trigger). When you are exposed to a trigger, your body releases a chemical (histamine) that causes redness, itching, and  swelling. You can get hives immediately after being exposed to a trigger or hours later. Hives do not spread from person to person (are not contagious). Your hives may get worse with scratching, exercise, and emotional stress. What are the causes? Causes of this condition include:  Allergies to certain foods or ingredients.  Insect bites or stings.  Exposure to pollen or pet dander.  Contact with latex or chemicals.  Spending time in sunlight, heat, or cold (exposure).  Exercise.  Stress.  You can also get hives from some medical conditions and treatments. These include:  Viruses, including the common cold.  Bacterial infections, such as urinary tract infections and strep throat.  Disorders such as vasculitis, lupus, or thyroid disease.  Certain medications.  Allergy shots.  Blood transfusions.  Sometimes, the cause of hives is not known (idiopathic hives). What increases the risk? This condition is more likely to develop in:  Women.  People who have food allergies, especially to citrus fruits, milk, eggs, peanuts, tree nuts, or shellfish.  People who are allergic to: ? Medicines. ? Latex. ? Insects. ? Animals. ? Pollen.  People who have certain medical conditions, includinglupus or thyroid disease.  What are the signs or symptoms? The main symptom of this condition is raised, itchyred or white bumps or patches on your skin. These areas may:  Become large and swollen (welts).  Change in shape and location, quickly and repeatedly.  Be separate hives or connect over a large area of skin.  Sting or become painful.  Turn white when pressed in the center (blanch).  In severe cases, yourhands, feet, and face may also become swollen. This may occur if hives develop deeper in your skin. How is this diagnosed? This condition is diagnosed based on your symptoms, medical history, and physical exam. Your skin, urine, or blood may be tested to find out what is  causing your hives and to rule out other health issues. Your health care provider may also remove a small sample of skin from the affected area and examine it under a microscope (biopsy). How is this treated? Treatment depends on the severity of your condition. Your health care provider may recommend using cool, wet cloths (cool compresses) or taking cool showers to relieve itching. Hives are sometimes treated with medicines, including:  Antihistamines.  Corticosteroids.  Antibiotics.  An injectable medicine (omalizumab). Your health care provider may prescribe this if you have chronic idiopathic hives and you continue to have symptoms even after treatment with antihistamines.  Severe cases may require an emergency injection of adrenaline (epinephrine) to prevent a life-threatening allergic reaction (anaphylaxis). Follow these instructions at home: Medicines  Take or  apply over-the-counter and prescription medicines only as told by your health care provider.  If you were prescribed an antibiotic medicine, use it as told by your health care provider. Do not stop taking the antibiotic even if you start to feel better. Skin Care  Apply cool compresses to the affected areas.  Do not scratch or rub your skin. General instructions  Do not take hot showers or baths. This can make itching worse.  Do not wear tight-fitting clothing.  Use sunscreen and wear protective clothing when you are outside.  Avoid any substances that cause your hives. Keep a journal to help you track what causes your hives. Write down: ? What medicines you take. ? What you eat and drink. ? What products you use on your skin.  Keep all follow-up visits as told by your health care provider. This is important. Contact a health care provider if:  Your symptoms are not controlled with medicine.  Your joints are painful or swollen. Get help right away if:  You have a fever.  You have pain in your  abdomen.  Your tongue or lips are swollen.  Your eyelids are swollen.  Your chest or throat feels tight.  You have trouble breathing or swallowing. These symptoms may represent a serious problem that is an emergency. Do not wait to see if the symptoms will go away. Get medical help right away. Call your local emergency services (911 in the [place] Do not drive yourself to the hospital. This information is not intended to replace advice given to you by your health care provider. Make sure you discuss any questions you have with your health care provider. Document Released: 04/11/2005 Document Revised: 09/09/2015 Document Reviewed: 01/28/2015 Elsevier Interactive Patient Education  2018 [employer].

## 2017-05-17 NOTE — Progress Notes (Signed)
HPI:                                                                Eduardo Parker is a 37 y.o. male who presents to [facility]: Primary Care Sports Medicine today for rash and anxiety follow-up  Onset: 3 days Location: started on lower legs, has spread to trunk Duration: waxing and waning Character: itchy Aggravating factors / Triggers: nothing Evolution: sometimes there are red bumps, other times there is just itching without a rash Treatments tried: oatmeal bath, moderate relief  Recent illness / systemic symptoms: none  Medication / drug exposure: none Recent travel: none Animal/insect exposure: cats at home History of allergies: yes, environmental Exposure to new soaps, perfumes, cleaning products: none Exposure to chemicals: none  Depression/Anxiety: taking Effexor without difficulty. Reports he increased his dose to 150 mg daily. Continues to drink 4-5 alcoholic beverages nightly. Denies symptoms of mania/hypomania. Denies suicidal thinking. Denies auditory/visual hallucinations.      Depression screen PHQ 2/9 05/17/2017  Decreased Interest 1  Down, Depressed, Hopeless 1  PHQ - 2 Score 2  Altered sleeping 1  Tired, decreased energy 1  Change in appetite 2  Feeling bad or failure about yourself  2  Trouble concentrating 0  Suicidal thoughts 0  PHQ-9 Score 8   GAD 7 : Generalized Anxiety Score 05/17/2017  Nervous, Anxious, on Edge 0  Control/stop worrying 0  Worry too much - different things 0  Trouble relaxing 1  Restless 0  Easily annoyed or irritable 2  Afraid - awful might happen 0  Total GAD 7 Score 3    Past Medical History:  Diagnosis Date  . 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 Anxiety   . Hypertriglyceridemia without hypercholesterolemia 04/29/2015   History reviewed. No pertinent surgical history. Social History   Tobacco Use  . Smoking status: Never Smoker  . Smokeless tobacco: Never Used  Substance Use Topics  .  Alcohol use: Yes   family history includes Breast cancer in his mother; Depression in his father and mother.    ROS: Review of Systems  Constitutional: Positive for malaise/fatigue.  HENT: Positive for congestion.        + dry mouth  Endo/Heme/Allergies: Positive for environmental allergies.  Psychiatric/Behavioral: Positive for depression and substance abuse. Negative for suicidal ideas. The patient is nervous/anxious.   All other systems reviewed and are negative.    Medications: Current Outpatient Medications  Medication Sig Dispense Refill  . ALPRAZolam (XANAX) 0.5 MG tablet Take 1 tablet (0.5 mg total) by mouth daily as needed for anxiety. 30 tablet 2  . Melatonin 10 MG CAPS Take by mouth.    . Omega-3 Fatty Acids (FISH OIL) 1000 MG CAPS One by mouth BID  0  . pantoprazole (PROTONIX) 20 MG tablet Take 1 tablet (20 mg total) by mouth daily. Due for follow up visit 30 tablet 0  . venlafaxine XR (EFFEXOR-XR) 75 MG 24 hr capsule Take 2 capsules (150 mg total) by mouth daily with breakfast. Due for follow up visit 60 capsule 5  . cetirizine (ZYRTEC) 10 MG tablet Take 1 tablet (10 mg total) by mouth daily. 30 tablet 11  . fluticasone (FLONASE) 50 MCG/ACT nasal spray Place 1 spray into both nostrils  daily as needed for allergies or rhinitis. 16 g 3  . Multiple Vitamin (MULTIVITAMIN) capsule Take 1 capsule by mouth daily.     No current facility-administered medications for this visit.    Allergies  Allergen Reactions  . Penicillins     childhood       Objective:  BP 131/84   Pulse 72   Temp 97.9 F (36.6 C) (Oral)   Wt 279 lb (126.6 kg)   BMI 33.08 kg/m  Gen:  alert, not ill-appearing, no distress, appropriate for age, obese male HEENT: head normocephalic without obvious abnormality, conjunctiva and cornea clear, trachea midline Pulm: Normal work of breathing, normal phonation, clear to auscultation bilaterally, no wheezes, rales or rhonchi CV: Normal rate, regular  rhythm, s1 and s2 distinct, no murmurs, clicks or rubs  Neuro: alert and oriented x 3, no tremor MSK: extremities atraumatic, normal gait and station Skin: urticaria of bilateral lower extremities Psych: well-groomed, cooperative, good eye contact, depressed mood, affect mood-congruent, speech is articulate, and thought processes clear and goal-directed    No results found for this or any previous visit (from the past 72 hour(s)). No results found.    Assessment and Plan: 37 y.o. male with   1. Generalized anxiety disorder, Dysthymia - PHQ9 score 8, no acute safety issues - GAD7 score 3. Well-controlled at 150 mg daily - venlafaxine XR (EFFEXOR-XR) 75 MG 24 hr capsule; Take 2 capsules (150 mg total) by mouth daily with breakfast. Due for follow up visit  Dispense: 60 capsule; Refill: 5  2. Elevated ALT measurement - last ALT 63, AST:ALT 0.57 - Comprehensive metabolic panel - US ABDOMEN LIMITED RUQ; Future  3. Rash and nonspecific skin eruption - cetirizine (ZYRTEC) 10 MG tablet; Take 1 tablet (10 mg total) by mouth daily.  Dispense: 30 tablet; Refill: 11  4. Heavy alcohol consumption AUDIT score = 15 Counseled on cutting back to 2 standard drinks per day Monitoring LFTs, vitamin levels. Annual RUQ US - Comprehensive metabolic panel - [id number] and Folate Panel - Vitamin B1 - US ABDOMEN LIMITED RUQ; Future  5. Deviated nasal septum - fluticasone (FLONASE) 50 MCG/ACT nasal spray; Place 1 spray into both nostrils daily as needed for allergies or rhinitis.  Dispense: 16 g; Refill: 3  6. Environmental allergies - fluticasone (FLONASE) 50 MCG/ACT nasal spray; Place 1 spray into both nostrils daily as needed for allergies or rhinitis.  Dispense: 16 g; Refill: 3  7. Encounter for vitamin deficiency screening - B12 and Folate Panel - Vitamin B1   Patient education and anticipatory guidance given Patient agrees with treatment plan Follow-up in 6 months for medication management as  needed if symptoms worsen or fail to improve  Levonne Hubertharley E. Mariella Blackwelder PA-C

## 2017-05-19 ENCOUNTER — Ambulatory Visit (INDEPENDENT_AMBULATORY_CARE_PROVIDER_SITE_OTHER): Payer: BLUE CROSS/BLUE SHIELD

## 2017-05-19 DIAGNOSIS — R740 Nonspecific elevation of levels of transaminase and lactic acid dehydrogenase [LDH]: Secondary | ICD-10-CM | POA: Diagnosis not present

## 2017-05-19 DIAGNOSIS — Z789 Other specified health status: Secondary | ICD-10-CM

## 2017-05-19 DIAGNOSIS — F1090 Alcohol use, unspecified, uncomplicated: Secondary | ICD-10-CM

## 2017-05-19 DIAGNOSIS — R7401 Elevation of levels of liver transaminase levels: Secondary | ICD-10-CM

## 2017-05-21 LAB — COMPREHENSIVE METABOLIC PANEL
AG Ratio: 1.4 (calc) (ref 1.0–2.5)
ALBUMIN MSPROF: 4.2 g/dL (ref 3.6–5.1)
ALKALINE PHOSPHATASE (APISO): 143 U/L — AB (ref 40–115)
ALT: 68 U/L — AB (ref 9–46)
AST: 42 U/L — AB (ref 10–40)
BUN: 11 mg/dL (ref 7–25)
CO2: 27 mmol/L (ref 20–32)
Calcium: 9.3 mg/dL (ref 8.6–10.3)
Chloride: 99 mmol/L (ref 98–110)
Creat: 1.21 mg/dL (ref 0.60–1.35)
GLOBULIN: 3 g/dL (ref 1.9–3.7)
GLUCOSE: 87 mg/dL (ref 65–99)
Potassium: 4 mmol/L (ref 3.5–5.3)
Sodium: 135 mmol/L (ref 135–146)
TOTAL PROTEIN: 7.2 g/dL (ref 6.1–8.1)
Total Bilirubin: 1.6 mg/dL — ABNORMAL HIGH (ref 0.2–1.2)

## 2017-05-21 LAB — B12 AND FOLATE PANEL
FOLATE: 5.8 ng/mL
VITAMIN B 12: 318 pg/mL (ref 200–1100)

## 2017-05-21 LAB — VITAMIN B1: Vitamin B1 (Thiamine): 7 nmol/L — ABNORMAL LOW (ref 8–30)

## 2017-05-22 ENCOUNTER — Other Ambulatory Visit: Payer: Self-pay | Admitting: Physician Assistant

## 2017-05-22 ENCOUNTER — Encounter: Payer: Self-pay | Admitting: Physician Assistant

## 2017-05-22 DIAGNOSIS — K700 Alcoholic fatty liver: Secondary | ICD-10-CM

## 2017-05-22 DIAGNOSIS — E519 Thiamine deficiency, unspecified: Secondary | ICD-10-CM | POA: Insufficient documentation

## 2017-05-22 HISTORY — DX: Alcoholic fatty liver: K70.0

## 2017-05-22 MED ORDER — MULTIVITAMINS PO CAPS: 1.0000 | ORAL_CAPSULE | Freq: Every day | ORAL | Status: DC

## 2017-05-22 NOTE — Progress Notes (Signed)
Labs and ultrasound show alcohol-induced fatty liver disease Needs to reduce alcohol consumption, no more than 2 standard drinks per day Follow low-fat diet Increase aerobic exercise Work on a healthy weight Will need to recheck labs every 6 months Will need annual ultrasound of the liver

## 2017-05-22 NOTE — Progress Notes (Signed)
Also needs daily multivitamin. Thiamine is low

## 2017-05-30 ENCOUNTER — Encounter: Payer: Self-pay | Admitting: Physician Assistant

## 2017-05-30 DIAGNOSIS — Z9109 Other allergy status, other than to drugs and biological substances: Secondary | ICD-10-CM | POA: Insufficient documentation

## 2017-05-30 DIAGNOSIS — F341 Dysthymic disorder: Secondary | ICD-10-CM | POA: Insufficient documentation

## 2017-05-30 DIAGNOSIS — J342 Deviated nasal septum: Secondary | ICD-10-CM | POA: Insufficient documentation

## 2017-05-30 DIAGNOSIS — R030 Elevated blood-pressure reading, without diagnosis of hypertension: Secondary | ICD-10-CM | POA: Insufficient documentation

## 2017-06-10 ENCOUNTER — Other Ambulatory Visit: Payer: Self-pay | Admitting: Physician Assistant

## 2017-06-10 DIAGNOSIS — K2100 Gastro-esophageal reflux disease with esophagitis, without bleeding: Secondary | ICD-10-CM

## 2017-06-14 ENCOUNTER — Other Ambulatory Visit: Payer: Self-pay | Admitting: Physician Assistant

## 2017-06-14 DIAGNOSIS — F411 Generalized anxiety disorder: Secondary | ICD-10-CM

## 2017-06-26 DIAGNOSIS — H6123 Impacted cerumen, bilateral: Secondary | ICD-10-CM | POA: Diagnosis not present

## 2017-08-09 ENCOUNTER — Other Ambulatory Visit: Payer: Self-pay | Admitting: Physician Assistant

## 2017-08-09 DIAGNOSIS — J342 Deviated nasal septum: Secondary | ICD-10-CM

## 2017-08-09 DIAGNOSIS — Z9109 Other allergy status, other than to drugs and biological substances: Secondary | ICD-10-CM

## 2017-12-24 ENCOUNTER — Other Ambulatory Visit: Payer: Self-pay | Admitting: Physician Assistant

## 2017-12-24 DIAGNOSIS — F411 Generalized anxiety disorder: Secondary | ICD-10-CM

## 2018-01-05 ENCOUNTER — Other Ambulatory Visit: Payer: Self-pay | Admitting: Physician Assistant

## 2018-01-05 DIAGNOSIS — K2100 Gastro-esophageal reflux disease with esophagitis, without bleeding: Secondary | ICD-10-CM

## 2018-01-31 ENCOUNTER — Other Ambulatory Visit: Payer: Self-pay | Admitting: Physician Assistant

## 2018-01-31 DIAGNOSIS — K2100 Gastro-esophageal reflux disease with esophagitis, without bleeding: Secondary | ICD-10-CM

## 2018-02-05 DIAGNOSIS — D869 Sarcoidosis, unspecified: Secondary | ICD-10-CM | POA: Diagnosis not present

## 2018-02-05 DIAGNOSIS — L308 Other specified dermatitis: Secondary | ICD-10-CM | POA: Diagnosis not present

## 2018-02-12 ENCOUNTER — Ambulatory Visit (INDEPENDENT_AMBULATORY_CARE_PROVIDER_SITE_OTHER): Payer: BLUE CROSS/BLUE SHIELD | Admitting: Physician Assistant

## 2018-02-12 ENCOUNTER — Encounter: Payer: Self-pay | Admitting: Physician Assistant

## 2018-02-12 VITALS — BP 125/73 | HR 72 | Wt 276.0 lb

## 2018-02-12 DIAGNOSIS — Z130 Encounter for screening for diseases of the blood and blood-forming organs and certain disorders involving the immune mechanism: Secondary | ICD-10-CM

## 2018-02-12 DIAGNOSIS — E781 Pure hyperglyceridemia: Secondary | ICD-10-CM | POA: Diagnosis not present

## 2018-02-12 DIAGNOSIS — Z789 Other specified health status: Secondary | ICD-10-CM | POA: Diagnosis not present

## 2018-02-12 DIAGNOSIS — Z3009 Encounter for other general counseling and advice on contraception: Secondary | ICD-10-CM

## 2018-02-12 DIAGNOSIS — F411 Generalized anxiety disorder: Secondary | ICD-10-CM

## 2018-02-12 DIAGNOSIS — K700 Alcoholic fatty liver: Secondary | ICD-10-CM

## 2018-02-12 DIAGNOSIS — E519 Thiamine deficiency, unspecified: Secondary | ICD-10-CM | POA: Diagnosis not present

## 2018-02-12 DIAGNOSIS — K2100 Gastro-esophageal reflux disease with esophagitis, without bleeding: Secondary | ICD-10-CM

## 2018-02-12 DIAGNOSIS — F1090 Alcohol use, unspecified, uncomplicated: Secondary | ICD-10-CM

## 2018-02-12 MED ORDER — PANTOPRAZOLE SODIUM 20 MG PO TBEC: 20.0000 mg | DELAYED_RELEASE_TABLET | Freq: Every day | ORAL | 1 refills | Status: DC

## 2018-02-12 NOTE — Progress Notes (Signed)
HPI:                                                                Eduardo Parker is a 37 y.o. male who presents to [facility]: Primary Care Sports Medicine today for medication management  Taking Protonix for GERD. Symptoms are well controlled.  Denies constitutional symptoms, dysphagia, odynophagia, nausea/vomiting, change in bowel habits, hematochezia/melena. Drinks 4-5 alcoholic beverages nightly  He was found to have a thiamine deficiency and has been taking a multivitamin. He also had transaminitis and increased liver echogenicity on RUQ [place] 9 months ago. Denies fatigue, weakness, abdominal pain, jaundice   Depression/Anxiety: taking Effexor 75 mg without difficulty. He is interested in possibly tapering off of this. Has noticed dizziness with missed dosages. Denies symptoms of mania/hypomania. Denies suicidal thinking. Denies auditory/visual hallucinations.  Requesting referral for vasectomy evaluation  Depression screen [facility] 2/9 02/12/2018 05/17/2017  Decreased Interest 1 1  Down, Depressed, Hopeless 1 1  PHQ - 2 Score 2 2  Altered sleeping 1 1  Tired, decreased energy 2 1  Change in appetite 1 2  Feeling bad or failure about yourself  1 2  Trouble concentrating 0 0  Moving slowly or fidgety/restless 0 -  Suicidal thoughts 0 0  PHQ-9 Score 7 8  Difficult doing work/chores Not difficult at all -    GAD 7 : Generalized Anxiety Score 02/12/2018 05/17/2017  Nervous, Anxious, on Edge 1 0  Control/stop worrying 0 0  Worry too much - different things 0 0  Trouble relaxing 1 1  Restless 0 0  Easily annoyed or irritable 0 2  Afraid - awful might happen 0 0  Total GAD 7 Score 2 3  Anxiety Difficulty Not difficult at all -      Past Medical History:  Diagnosis Date  . 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 Anxiety   . Hypertriglyceridemia without hypercholesterolemia 04/29/2015   History reviewed. No pertinent surgical history. Social History    Tobacco Use  . Smoking status: Never Smoker  . Smokeless tobacco: Never Used  Substance Use Topics  . Alcohol use: Yes   family history includes Breast cancer in his mother; Depression in his father and mother.    ROS: negative except as noted in the HPI  Medications: Current Outpatient Medications  Medication Sig Dispense Refill  . Multiple Vitamin (MULTIVITAMIN) capsule Take 1 capsule by mouth daily.    . Omega-3 Fatty Acids (FISH OIL) 1000 MG CAPS One by mouth BID  0  . pantoprazole (PROTONIX) 20 MG tablet Take 1 tablet (20 mg total) by mouth daily. 90 tablet 1  . venlafaxine XR (EFFEXOR-XR) 75 MG 24 hr capsule Take 1 capsule (75 mg total) by mouth daily with breakfast. Due for follow up 90 capsule 0   No current facility-administered medications for this visit.    Allergies  Allergen Reactions  . Penicillins     childhood       Objective:  BP 125/73   Pulse 72   Wt 276 lb (125.2 kg)   BMI 32.73 kg/m  Gen:  alert, not ill-appearing, no distress, appropriate for age, obese male HEENT: head normocephalic without obvious abnormality, conjunctiva and cornea clear, trachea midline Pulm: Normal work of breathing, normal  phonation, clear to auscultation bilaterally, no wheezes, rales or rhonchi CV: Normal rate, regular rhythm, s1 and s2 distinct, no murmurs, clicks or rubs  GI: abdomen soft, non-tender, no guarding or rigidity, no palpable masses, exam limited due to body habitus Neuro: alert and oriented x 3, no tremor MSK: extremities atraumatic, normal gait and station Skin: intact, no rashes on exposed skin, no jaundice, no cyanosis Psych: well-groomed, cooperative, good eye contact, euthymic mood, affect mood-congruent, speech is articulate, and thought processes clear and goal-directed     No results found for this or any previous visit (from the past 72 hour(s)). No results found.    Assessment and Plan: 37 y.o. male with   .[place]rant was seen today for  medication refill.  Diagnoses and all orders for this visit:  Gastroesophageal reflux disease with esophagitis -     pantoprazole (PROTONIX) 20 MG tablet; Take 1 tablet (20 mg total) by mouth daily.  Alcohol induced fatty liver -     Hepatic function panel -     CBC  Heavy alcohol consumption  Thiamine deficiency -     Vitamin B1  Hypertriglyceridemia without hypercholesterolemia -     Lipid Panel w/reflex Direct LDL  Vasectomy evaluation -     Ambulatory referral to Urology  Screening for blood disease -     CBC  Generalized anxiety disorder   - GAD: GAD7=2, well controlled. We discussed Effexor taper. He states things at work are fairly stressful right now and he would rather continue on current 75 mg dose  - Alcohol induced fatty liver: encouraged to reduce alcohol consumption, follow a low-fat diet, increase aerobic exercise, and work on weight loss. Recheck liver function panel today. Due for surveillance [place] in 3 months  Patient education and anticipatory guidance given Patient agrees with treatment plan Follow-up in 3 months for [place] / 6 months for medication mgmt or sooner as needed if symptoms worsen or fail to improve  Levonne Hubert PA-C

## 2018-02-12 NOTE — Patient Instructions (Addendum)
Alcoholic Liver Disease °The liver is an organ that converts food into energy, absorbs vitamins from food, removes toxins from the blood, and makes proteins. Alcoholic liver disease happens when the liver becomes damaged due to alcohol consumption and it stops working properly. °What are the causes? °This condition is caused by drinking too much alcohol over a number of years. °What increases the risk? °This condition is more likely to develop in: °· Women. °· People who have a family history of the disease. °· People who have poor nutrition. °· People who are obese. ° °What are the signs or symptoms? °Early symptoms of this condition include: °· Fatigue. °· Weakness. °· Abdominal discomfort on the upper right side. ° °Symptoms of moderate disease include: °· Fever. °· Yellow, pale, or darkening skin. °· Abdominal pain. °· Nausea and vomiting. °· Weight loss. °· Loss of appetite. ° °Symptoms of advanced disease include: °· Abdominal swelling. °· Nosebleeds or bleeding gums. °· Itchy skin. °· Enlarged fingertips (clubbing). °· Light-colored stools that smell very bad (steatorrhea). °· Mood changes. °· Feeling agitated. °· Confusion. °· Trouble concentrating. ° °Some people do not have symptoms until the condition becomes severe. Symptoms are often worse after heavy drinking. °How is this diagnosed? °This condition is diagnosed with: °· A physical exam. °· Blood tests. °· Taking a sample of liver tissue to examine under a microscope (liver biopsy). ° °You may also have other tests, including: °· X-rays. °· Ultrasound. ° °How is this treated? °Treatment may include: °· Stopping alcohol use to allow the liver to heal. °· Joining a support group or meeting with a counselor. °· Medicines to reduce inflammation. These may be recommended if the disease is severe. °· A liver transplant. This is the only treatment if the disease is very severe. °· Nutritional therapy. This may involve: °? Taking vitamins. °? Eating foods that  are high in thiamine, such as whole-wheat cereals, pork, and raw vegetables. °? Eating foods that have a lot of folic acid, such as vegetables, fruits, meats, beans, nuts, and dairy foods. °? Eating a diet that includes carbohydrate-rich foods, such as yogurt, beans, potatoes, and rice. ° °Follow these instructions at home: °· Do not drink alcohol. °· Take medicines only as directed by your health care provider. °· Take vitamins only as directed by your health care provider. °· Follow any diet instructions that are given to you by your health care provider. °Contact a health care provider if: °· You have a fever. °· You have shortness of breath or have difficulty breathing. °· You have bright red blood in your stool, or you have black, tarry stools. °· You are vomiting blood. °· Your skin color becomes more yellow, pale, or dark. °· You develop headaches. °· You have trouble thinking. °· You have problems balancing or walking. °This information is not intended to replace advice given to you by your health care provider. Make sure you discuss any questions you have with your health care provider. °Document Released: 05/02/2014 Document Revised: 09/17/2015 Document Reviewed: 03/13/2014 °Elsevier Interactive Patient Education © 2018 [employer] ° °

## 2018-02-13 MED ORDER — ICOSAPENT ETHYL 1 G PO CAPS: 2.0000 | ORAL_CAPSULE | Freq: Two times a day (BID) | ORAL | 5 refills | Status: DC

## 2018-02-13 NOTE — Addendum Note (Signed)
Addended by: Gena Fray E on: 02/13/2018 03:11 PM   Modules accepted: Orders

## 2018-02-13 NOTE — Progress Notes (Signed)
Liver enzymes still increased, but stable. Still recommend low fat diet and reducing alcohol intake Triglycerides are very high. This increases chance of developing pancreatitis. Needs prescription strength fish oil. Stop OTC and switch to Vascepa. If he can't afford this, let us know.

## 2018-02-14 DIAGNOSIS — D869 Sarcoidosis, unspecified: Secondary | ICD-10-CM | POA: Diagnosis not present

## 2018-02-16 LAB — LIPID PANEL W/REFLEX DIRECT LDL
Cholesterol: 186 mg/dL (ref <200)
HDL: 28 mg/dL — ABNORMAL LOW (ref >40)
NON-HDL CHOLESTEROL (CALC): 158 mg/dL — AB (ref <130)
TRIGLYCERIDES: 626 mg/dL — AB (ref <150)
Total CHOL/HDL Ratio: 6.6 (calc) — ABNORMAL HIGH (ref <5.0)

## 2018-02-16 LAB — HEPATIC FUNCTION PANEL
AG RATIO: 1.5 (calc) (ref 1.0–2.5)
ALBUMIN MSPROF: 4.6 g/dL (ref 3.6–5.1)
ALKALINE PHOSPHATASE (APISO): 111 U/L (ref 40–115)
ALT: 61 U/L — AB (ref 9–46)
AST: 45 U/L — AB (ref 10–40)
BILIRUBIN DIRECT: 0.2 mg/dL (ref 0.0–0.2)
BILIRUBIN TOTAL: 1.1 mg/dL (ref 0.2–1.2)
Globulin: 3 g/dL (calc) (ref 1.9–3.7)
Indirect Bilirubin: 0.9 mg/dL (calc) (ref 0.2–1.2)
Total Protein: 7.6 g/dL (ref 6.1–8.1)

## 2018-02-16 LAB — CBC
HCT: 48.8 % (ref 38.5–50.0)
Hemoglobin: 17 g/dL (ref 13.2–17.1)
MCH: 31.3 pg (ref 27.0–33.0)
MCHC: 34.8 g/dL (ref 32.0–36.0)
MCV: 89.7 fL (ref 80.0–100.0)
MPV: 11.3 fL (ref 7.5–12.5)
PLATELETS: 236 10*3/uL (ref 140–400)
RBC: 5.44 10*6/uL (ref 4.20–5.80)
RDW: 13.8 % (ref 11.0–15.0)
WBC: 5.2 10*3/uL (ref 3.8–10.8)

## 2018-02-16 LAB — VITAMIN B1: Vitamin B1 (Thiamine): 12 nmol/L (ref 8–30)

## 2018-02-16 LAB — DIRECT LDL: LDL DIRECT: 86 mg/dL (ref <100)

## 2018-03-25 ENCOUNTER — Other Ambulatory Visit: Payer: Self-pay | Admitting: Physician Assistant

## 2018-03-25 DIAGNOSIS — F411 Generalized anxiety disorder: Secondary | ICD-10-CM

## 2018-04-24 ENCOUNTER — Other Ambulatory Visit: Payer: Self-pay | Admitting: Physician Assistant

## 2018-04-24 DIAGNOSIS — F411 Generalized anxiety disorder: Secondary | ICD-10-CM

## 2018-04-25 HISTORY — PX: LYMPH NODE BIOPSY: SHX201

## 2018-05-23 ENCOUNTER — Other Ambulatory Visit: Payer: Self-pay | Admitting: Physician Assistant

## 2018-05-23 DIAGNOSIS — F411 Generalized anxiety disorder: Secondary | ICD-10-CM

## 2018-06-08 ENCOUNTER — Other Ambulatory Visit: Payer: Self-pay | Admitting: Physician Assistant

## 2018-06-08 DIAGNOSIS — F411 Generalized anxiety disorder: Secondary | ICD-10-CM

## 2018-06-23 IMAGING — US US ABDOMEN LIMITED
1 series · 14 of 25 positions shown · non-contrast
Comparison: None

CLINICAL DATA: Transaminitis, elevated ALT, heavy alcohol
consumption

EXAM:
ULTRASOUND ABDOMEN LIMITED RIGHT UPPER QUADRANT

[Series 1: us abdomen limited · 0.22mm/px · 14 of 39 slices shown]
[im 1/39]
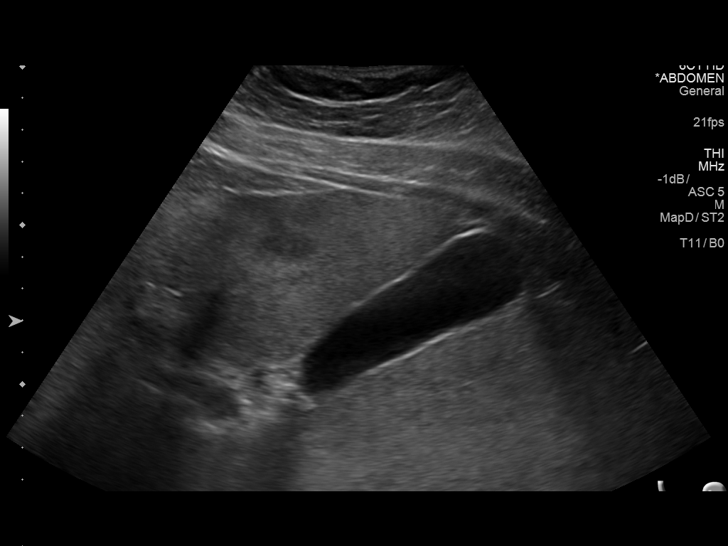
[im 4/39]
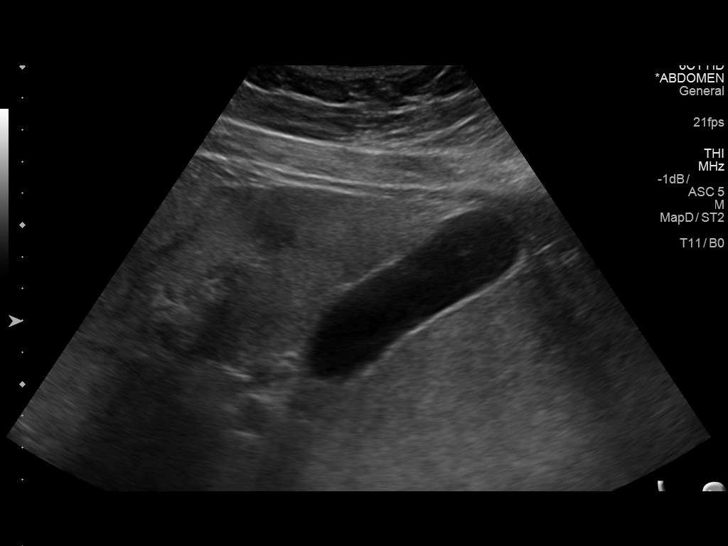
[im 7/39]
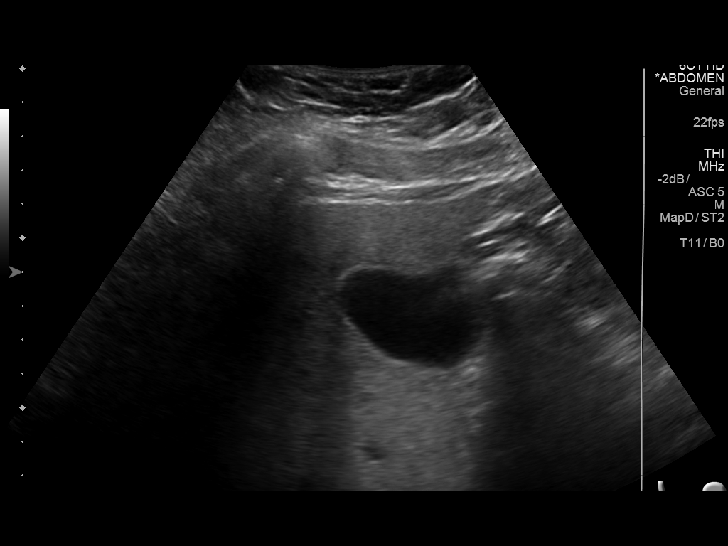
[im 10/39]
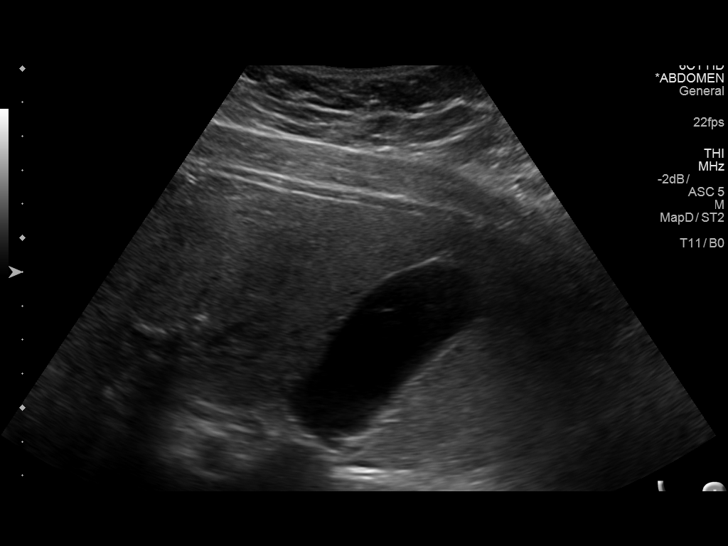
[im 13/39]
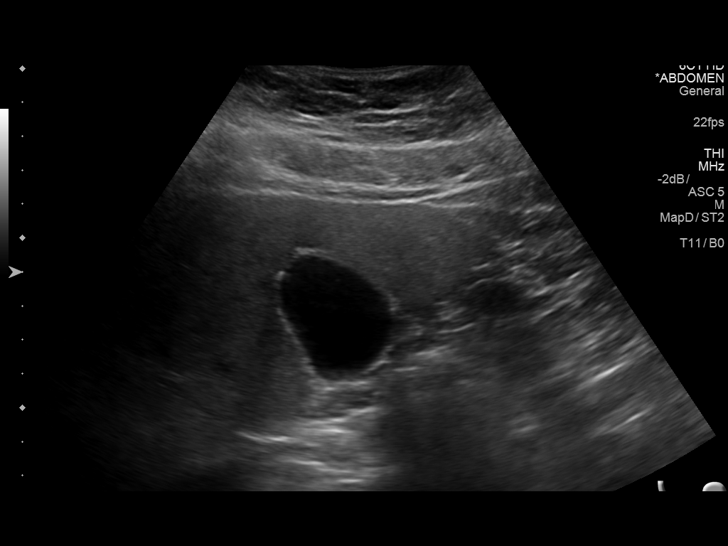
[im 15/39]
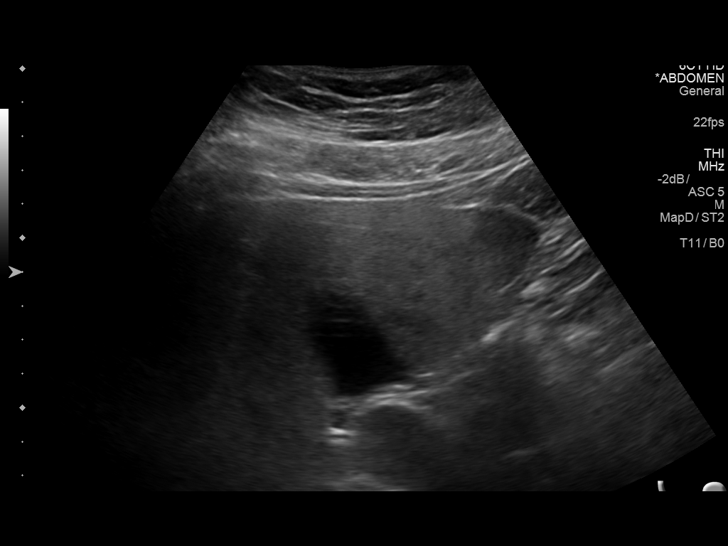
[im 18/39]
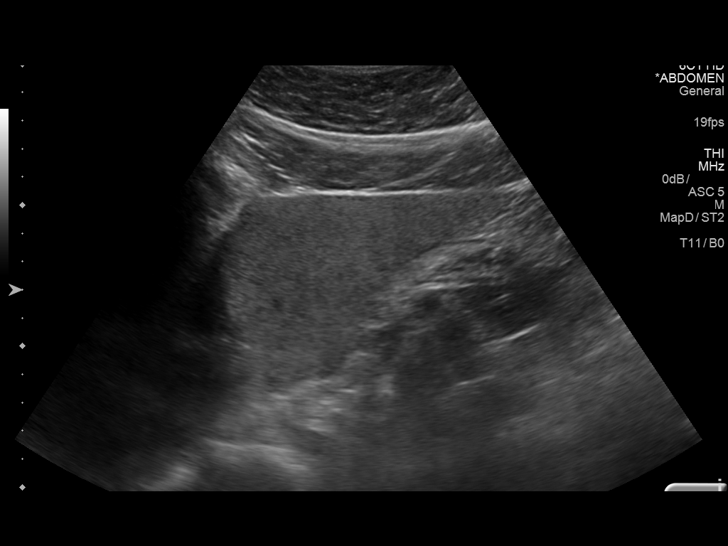
[im 21/39]
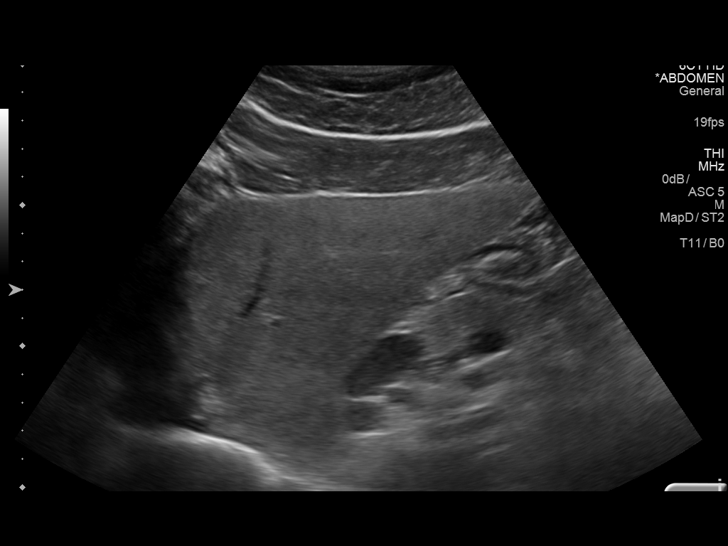
[im 24/39]
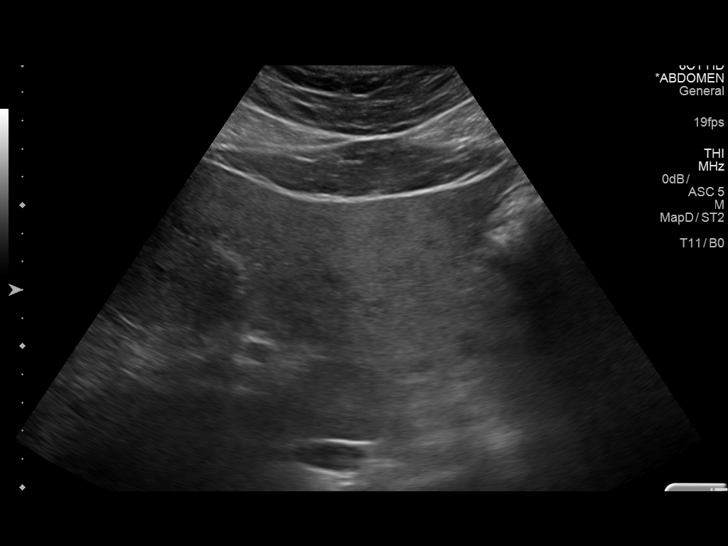
[im 26/39]
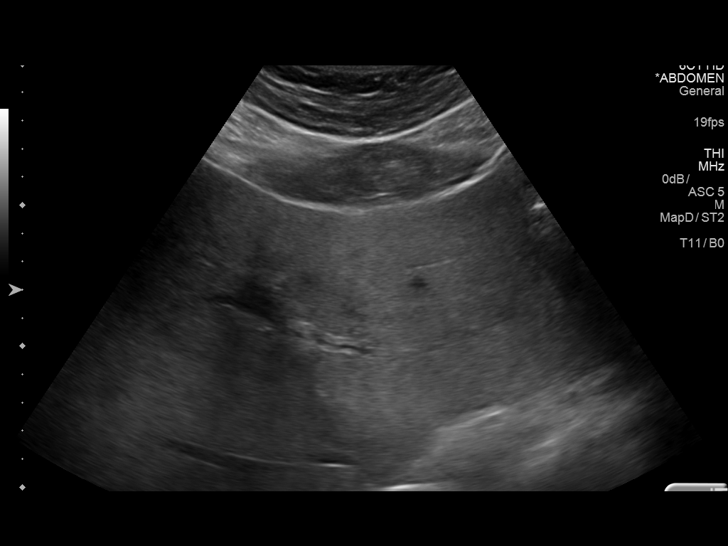
[im 29/39]
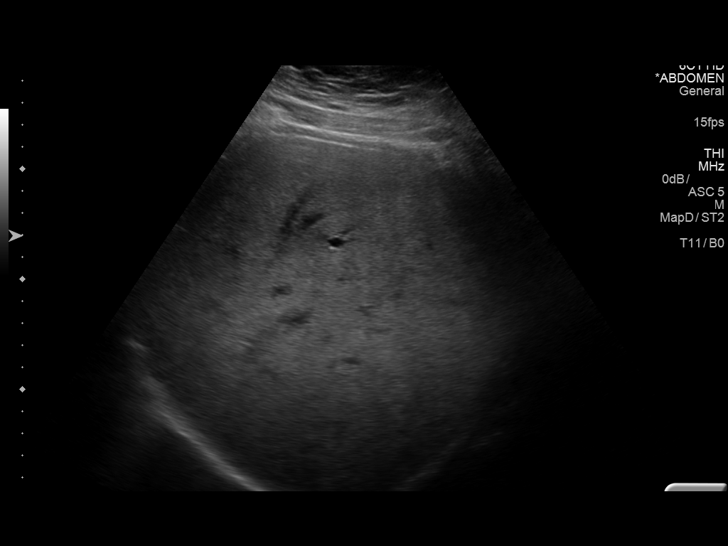
[im 32/39]
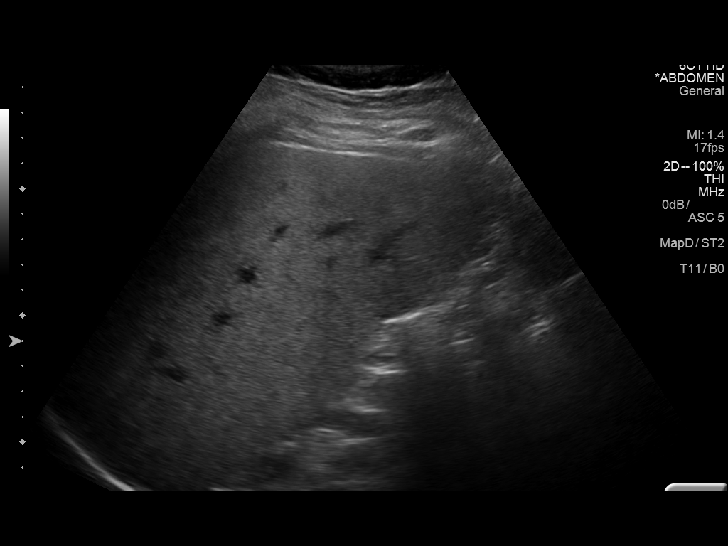
[im 35/39]
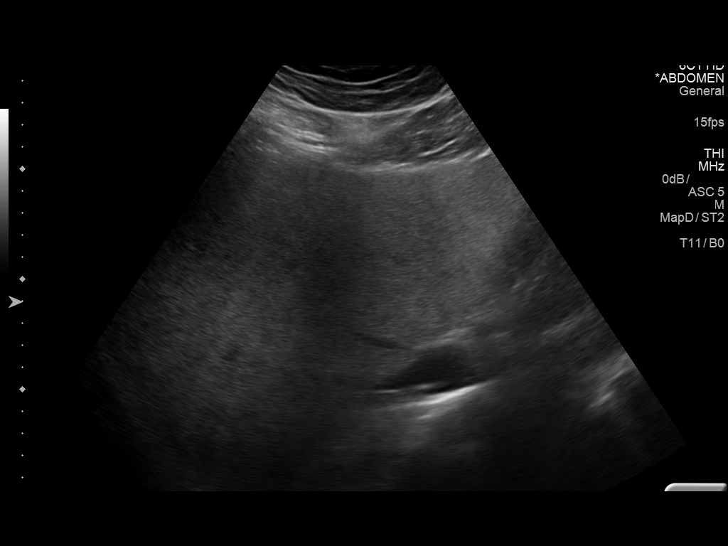
[im 39/39]
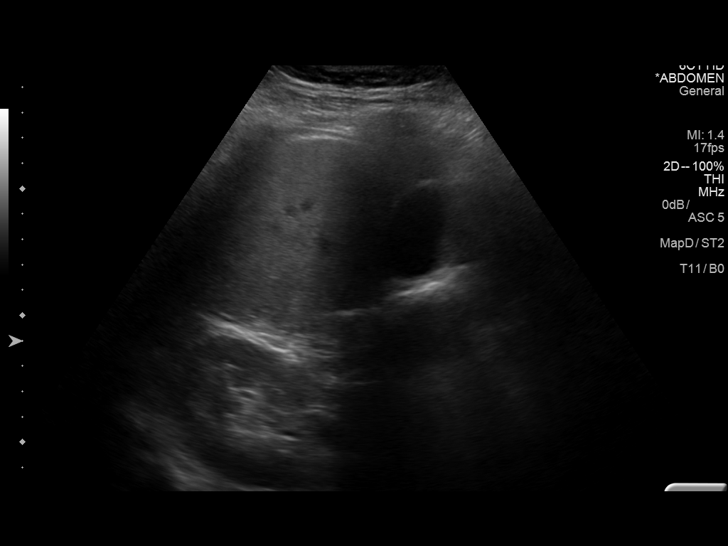

[14 of 25 positions shown; findings below may reference images not displayed]

FINDINGS: Gallbladder:

Normally distended without stones or wall thickening. No
pericholecystic fluid or sonographic Murphy sign.

Common bile duct:

Diameter: 3 mm diameter, normal

Liver:

Echogenic parenchyma, likely fatty infiltration though this can be
seen with cirrhosis and certain infiltrative disorders. No focal
hepatic mass or nodularity. Portal vein is patent on color Doppler
imaging with normal direction of blood flow towards the liver.

No RIGHT upper quadrant free fluid.
IMPRESSION: Probable fatty infiltration of liver as above.

Otherwise negative exam.

## 2018-06-30 ENCOUNTER — Other Ambulatory Visit: Payer: Self-pay | Admitting: Physician Assistant

## 2018-06-30 DIAGNOSIS — F411 Generalized anxiety disorder: Secondary | ICD-10-CM

## 2018-07-11 ENCOUNTER — Other Ambulatory Visit: Payer: Self-pay | Admitting: Physician Assistant

## 2018-07-11 DIAGNOSIS — F411 Generalized anxiety disorder: Secondary | ICD-10-CM

## 2018-07-12 ENCOUNTER — Ambulatory Visit: Payer: BLUE CROSS/BLUE SHIELD | Admitting: Physician Assistant

## 2018-07-12 ENCOUNTER — Other Ambulatory Visit: Payer: Self-pay

## 2018-07-12 ENCOUNTER — Encounter: Payer: Self-pay | Admitting: Physician Assistant

## 2018-07-12 VITALS — BP 140/89 | HR 78 | Wt 286.0 lb

## 2018-07-12 DIAGNOSIS — R590 Localized enlarged lymph nodes: Secondary | ICD-10-CM

## 2018-07-12 DIAGNOSIS — Z79899 Other long term (current) drug therapy: Secondary | ICD-10-CM

## 2018-07-12 DIAGNOSIS — E781 Pure hyperglyceridemia: Secondary | ICD-10-CM

## 2018-07-12 DIAGNOSIS — R030 Elevated blood-pressure reading, without diagnosis of hypertension: Secondary | ICD-10-CM

## 2018-07-12 DIAGNOSIS — K2100 Gastro-esophageal reflux disease with esophagitis, without bleeding: Secondary | ICD-10-CM | POA: Insufficient documentation

## 2018-07-12 DIAGNOSIS — K219 Gastro-esophageal reflux disease without esophagitis: Secondary | ICD-10-CM

## 2018-07-12 DIAGNOSIS — F411 Generalized anxiety disorder: Secondary | ICD-10-CM | POA: Diagnosis not present

## 2018-07-12 DIAGNOSIS — K700 Alcoholic fatty liver: Secondary | ICD-10-CM

## 2018-07-12 DIAGNOSIS — R221 Localized swelling, mass and lump, neck: Secondary | ICD-10-CM

## 2018-07-12 MED ORDER — ICOSAPENT ETHYL 1 G PO CAPS: 2.0000 | ORAL_CAPSULE | Freq: Two times a day (BID) | ORAL | 0 refills | Status: DC

## 2018-07-12 MED ORDER — PANTOPRAZOLE SODIUM 20 MG PO TBEC: 20.0000 mg | DELAYED_RELEASE_TABLET | Freq: Every day | ORAL | 0 refills | Status: DC

## 2018-07-12 MED ORDER — VENLAFAXINE HCL ER 75 MG PO CP24: 75.0000 mg | ORAL_CAPSULE | Freq: Every day | ORAL | 0 refills | Status: DC

## 2018-07-12 NOTE — Patient Instructions (Addendum)
Currently all routine imaging has been postponed until April 5th. IF there is any abnormality on your blood work or if you develop new/worsening symptoms (fever, chills, worsening fatigue, nightsweats, unintended weight loss, enlarging lymph nodes) please contact me and we will get your ultrasound scheduled sooner  For your blood pressure: - Goal <130/80 (Ideally 120's/70's) - monitor and log blood pressures at home - check around the same time each day in a relaxed setting - Limit salt to <2500 mg/day - Follow DASH (Dietary Approach to Stopping Hypertension) eating plan - Try to get at least 150 minutes of aerobic exercise per week - Aim to go on a brisk walk 30 minutes per day at least 5 days per week. If you're not active, gradually increase how long you walk by 5 minutes each week - limit alcohol: 2 standard drinks per day for men and 1 per day for women - avoid tobacco/nicotine products. Consider smoking cessation if you smoke - weight loss: 7% of current body weight can reduce your blood pressure by 5-10 points - follow-up at least every 6 months for your blood pressure. Follow-up sooner if your BP is not controlled   DASH Eating Plan DASH stands for "Dietary Approaches to Stop Hypertension." The DASH eating plan is a healthy eating plan that has been shown to reduce high blood pressure (hypertension). It may also reduce your risk for type 2 diabetes, heart disease, and stroke. The DASH eating plan may also help with weight loss. What are tips for following this plan?  General guidelines  Avoid eating more than 2,300 mg (milligrams) of salt (sodium) a day. If you have hypertension, you may need to reduce your sodium intake to 1,500 mg a day.  Limit alcohol intake to no more than 1 drink a day for nonpregnant women and 2 drinks a day for men. One drink equals 12 oz of beer, 5 oz of wine, or 1 oz of hard liquor.  Work with your health care provider to maintain a healthy body weight or  to lose weight. Ask what an ideal weight is for you.  Get at least 30 minutes of exercise that causes your heart to beat faster (aerobic exercise) most days of the week. Activities may include walking, swimming, or biking.  Work with your health care provider or diet and nutrition specialist (dietitian) to adjust your eating plan to your individual calorie needs. Reading food labels   Check food labels for the amount of sodium per serving. Choose foods with less than 5 percent of the Daily Value of sodium. Generally, foods with less than 300 mg of sodium per serving fit into this eating plan.  To find whole grains, look for the word "whole" as the first word in the ingredient list. Shopping  Buy products labeled as "low-sodium" or "no salt added."  Buy fresh foods. Avoid canned foods and premade or frozen meals. Cooking  Avoid adding salt when cooking. Use salt-free seasonings or herbs instead of table salt or sea salt. Check with your health care provider or pharmacist before using salt substitutes.  Do not fry foods. Cook foods using healthy methods such as baking, boiling, grilling, and broiling instead.  Cook with heart-healthy oils, such as olive, canola, soybean, or sunflower oil. Meal planning  Eat a balanced diet that includes: ? 5 or more servings of fruits and vegetables each day. At each meal, try to fill half of your plate with fruits and vegetables. ? Up to 6-8 servings of  whole grains each day. ? Less than 6 oz of lean meat, poultry, or fish each day. A 3-oz serving of meat is about the same size as a deck of cards. One egg equals 1 oz. ? 2 servings of low-fat dairy each day. ? A serving of nuts, seeds, or beans 5 times each week. ? Heart-healthy fats. Healthy fats called Omega-3 fatty acids are found in foods such as flaxseeds and coldwater fish, like sardines, salmon, and mackerel.  Limit how much you eat of the following: ? Canned or prepackaged foods. ? Food that  is high in trans fat, such as fried foods. ? Food that is high in saturated fat, such as fatty meat. ? Sweets, desserts, sugary drinks, and other foods with added sugar. ? Full-fat dairy products.  Do not salt foods before eating.  Try to eat at least 2 vegetarian meals each week.  Eat more home-cooked food and less restaurant, buffet, and fast food.  When eating at a restaurant, ask that your food be prepared with less salt or no salt, if possible. What foods are recommended? The items listed may not be a complete list. Talk with your dietitian about what dietary choices are best for you. Grains Whole-grain or whole-wheat bread. Whole-grain or whole-wheat pasta. Brown rice. Orpah Cobb. Bulgur. Whole-grain and low-sodium cereals. Pita bread. Low-fat, low-sodium crackers. Whole-wheat flour tortillas. Vegetables Fresh or frozen vegetables (raw, steamed, roasted, or grilled). Low-sodium or reduced-sodium tomato and vegetable juice. Low-sodium or reduced-sodium tomato sauce and tomato paste. Low-sodium or reduced-sodium canned vegetables. Fruits All fresh, dried, or frozen fruit. Canned fruit in natural juice (without added sugar). Meat and other protein foods Skinless chicken or [place]. Ground chicken or [place]. Pork with fat trimmed off. Fish and seafood. Egg whites. Dried beans, peas, or lentils. Unsalted nuts, nut butters, and seeds. Unsalted canned beans. Lean cuts of beef with fat trimmed off. Low-sodium, lean deli meat. Dairy Low-fat (1%) or fat-free (skim) milk. Fat-free, low-fat, or reduced-fat cheeses. Nonfat, low-sodium ricotta or cottage cheese. Low-fat or nonfat yogurt. Low-fat, low-sodium cheese. Fats and oils Soft margarine without trans fats. Vegetable oil. Low-fat, reduced-fat, or light mayonnaise and salad dressings (reduced-sodium). Canola, safflower, olive, soybean, and sunflower oils. Avocado. Seasoning and other foods Herbs. Spices. Seasoning mixes without salt.  Unsalted popcorn and pretzels. Fat-free sweets. What foods are not recommended? The items listed may not be a complete list. Talk with your dietitian about what dietary choices are best for you. Grains Baked goods made with fat, such as croissants, muffins, or some breads. Dry pasta or rice meal packs. Vegetables Creamed or fried vegetables. Vegetables in a cheese sauce. Regular canned vegetables (not low-sodium or reduced-sodium). Regular canned tomato sauce and paste (not low-sodium or reduced-sodium). Regular tomato and vegetable juice (not low-sodium or reduced-sodium). Rosita Fire. Olives. Fruits Canned fruit in a light or heavy syrup. Fried fruit. Fruit in cream or butter sauce. Meat and other protein foods Fatty cuts of meat. Ribs. Fried meat. Tomasa Blase. Sausage. Bologna and other processed lunch meats. Salami. Fatback. Hotdogs. Bratwurst. Salted nuts and seeds. Canned beans with added salt. Canned or smoked fish. Whole eggs or egg yolks. Chicken or [place] with skin. Dairy Whole or 2% milk, cream, and half-and-half. Whole or full-fat cream cheese. Whole-fat or sweetened yogurt. Full-fat cheese. Nondairy creamers. Whipped toppings. Processed cheese and cheese spreads. Fats and oils Butter. Stick margarine. Lard. Shortening. Ghee. Bacon fat. Tropical oils, such as coconut, palm kernel, or palm oil. Seasoning and other foods Salted  popcorn and pretzels. Onion salt, garlic salt, seasoned salt, table salt, and sea salt. Worcestershire sauce. Tartar sauce. Barbecue sauce. Teriyaki sauce. Soy sauce, including reduced-sodium. Steak sauce. Canned and packaged gravies. Fish sauce. Oyster sauce. Cocktail sauce. Horseradish that you find on the shelf. Ketchup. Mustard. Meat flavorings and tenderizers. Bouillon cubes. Hot sauce and Tabasco sauce. Premade or packaged marinades. Premade or packaged taco seasonings. Relishes. Regular salad dressings. Where to find more information:  National Heart, Lung, and Blood  Institute: PopSteam.is  American Heart Association: www.heart.org Summary  The DASH eating plan is a healthy eating plan that has been shown to reduce high blood pressure (hypertension). It may also reduce your risk for type 2 diabetes, heart disease, and stroke.  With the DASH eating plan, you should limit salt (sodium) intake to 2,300 mg a day. If you have hypertension, you may need to reduce your sodium intake to 1,500 mg a day.  When on the DASH eating plan, aim to eat more fresh fruits and vegetables, whole grains, lean proteins, low-fat dairy, and heart-healthy fats.  Work with your health care provider or diet and nutrition specialist (dietitian) to adjust your eating plan to your individual calorie needs. This information is not intended to replace advice given to you by your health care provider. Make sure you discuss any questions you have with your health care provider. Document Released: 03/31/2011 Document Revised: 04/04/2016 Document Reviewed: 04/04/2016 Elsevier Interactive Patient Education  2019 [employer].

## 2018-07-12 NOTE — Progress Notes (Signed)
HPI:                                                                Eduardo Parker is a 38 y.o. male who presents to [facility]: [facility] today for medication management  GERD: taking Protonix 20 mg QD. Symptoms are well controlled.  Denies constitutional symptoms, dysphagia, odynophagia, nausea/vomiting, change in bowel habits, hematochezia/melena.   Fatty liver disease: He was found to have a thiamine deficiency and has been taking a multivitamin. He also had transaminitis and increased liver echogenicity on RUQ [place] 9 months ago. Denies fatigue, weakness, abdominal pain, jaundice  Has reduced alcohol consumption from nightly to 4-5 drinks several nights per week.  Hypertriglyceridemia: TG's>600. He was started on Vascepa. Has been compliant with medication.  Depression/Anxiety: taking Effexor 75 mg without difficulty. Decided not to taper off of it because he felt more irritable. Denies symptoms of mania/hypomania. Denies suicidal thinking. Denies auditory/visual hallucinations.  Has noticed recurrent swollen lymph nodes of his neck, groin and right elbow. Groin nodes have been present for several years. For the last 6 months has noticed non-tender lymph nodes in his bilateral neck. Reports dentist also noticed they were enlarged, but he has no dental infections. Denies fever, chills, unintended weight loss or nightsweats.Denies dysphagia or odynophagia  Depression screen [facility] 2/9 07/12/2018 02/12/2018 05/17/2017  Decreased Interest 1 1 1   Down, Depressed, Hopeless 1 1 1   PHQ - 2 Score 2 2 2   Altered sleeping 0 1 1  Tired, decreased energy 2 2 1   Change in appetite 0 1 2  Feeling bad or failure about yourself  0 1 2  Trouble concentrating 1 0 0  Moving slowly or fidgety/restless 0 0 -  Suicidal thoughts 0 0 0  PHQ-9 Score 5 7 8   Difficult doing work/chores - Not difficult at all -    GAD 7 : Generalized Anxiety Score 07/12/2018 02/12/2018  05/17/2017  Nervous, Anxious, on Edge 0 1 0  Control/stop worrying 0 0 0  Worry too much - different things 0 0 0  Trouble relaxing 0 1 1  Restless 0 0 0  Easily annoyed or irritable 0 0 2  Afraid - awful might happen 0 0 0  Total GAD 7 Score 0 2 3  Anxiety Difficulty - Not difficult at all -      Past Medical History:  Diagnosis Date  . 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 Anxiety   . Hypertriglyceridemia without hypercholesterolemia 04/29/2015   History reviewed. No pertinent surgical history. Social History   Tobacco Use  . Smoking status: Never Smoker  . Smokeless tobacco: Never Used  Substance Use Topics  . Alcohol use: Yes   family history includes Breast cancer in his mother; Depression in his father and mother.    Review of Systems  Constitutional: Positive for diaphoresis (rare nightsweats). Negative for activity change, appetite change, chills, fatigue, fever and unexpected weight change.  Hematological: Positive for adenopathy. Does not bruise/bleed easily.     Medications: Current Outpatient Medications  Medication Sig Dispense Refill  . Icosapent Ethyl 1 g CAPS Take 2 capsules (2 g total) by mouth 2 (two) times daily. 360 capsule 0  . Multiple Vitamin (MULTIVITAMIN) capsule Take  1 capsule by mouth daily.    . pantoprazole (PROTONIX) 20 MG tablet Take 1 tablet (20 mg total) by mouth daily. 90 tablet 0  . venlafaxine XR (EFFEXOR-XR) 75 MG 24 hr capsule Take 1 capsule (75 mg total) by mouth daily with breakfast. Due for follow up visit w/PCP 90 capsule 0   No current facility-administered medications for this visit.    Allergies  Allergen Reactions  . Penicillins     childhood       Objective:  BP 140/89   Pulse 78   Wt 286 lb (129.7 kg)   BMI 33.91 kg/m  Gen:  alert, not ill-appearing, no distress, appropriate for age, obese male HEENT: head normocephalic without obvious abnormality, conjunctiva and cornea clear, oropharynx clear, posterior  cervical chain there are bilateral palpable lymph nodes largest in the left inferior area, no submandibular or submental adenopathy, trachea midline Pulm: Normal work of breathing, normal phonation, clear to auscultation bilaterally, no wheezes, rales or rhonchi CV: Normal rate, regular rhythm, s1 and s2 distinct, no murmurs, clicks or rubs  Neuro: alert and oriented x 3, no tremor MSK: extremities atraumatic, normal gait and station Skin: intact, no rashes on exposed skin, no jaundice, no cyanosis Psych: well-groomed, cooperative, good eye contact, euthymic mood, affect mood-congruent, speech is articulate, and thought processes clear and goal-directed  BP Readings from Last 3 Encounters:  07/12/18 140/89  02/12/18 125/73  05/17/17 131/84   Wt Readings from Last 3 Encounters:  07/12/18 286 lb (129.7 kg)  02/12/18 276 lb (125.2 kg)  05/17/17 279 lb (126.6 kg)   Lab Results  Component Value Date   CREATININE 1.21 05/17/2017   BUN 11 05/17/2017   NA 135 05/17/2017   K 4.0 05/17/2017   CL 99 05/17/2017   CO2 27 05/17/2017   Lab Results  Component Value Date   ALT 61 (H) 02/12/2018   AST 45 (H) 02/12/2018   ALKPHOS 81 08/22/2016   BILITOT 1.1 02/12/2018   Lab Results  Component Value Date   WBC 5.2 02/12/2018   HGB 17.0 02/12/2018   HCT 48.8 02/12/2018   MCV 89.7 02/12/2018   PLT 236 02/12/2018   Lab Results  Component Value Date   CHOL 186 02/12/2018   HDL 28 (L) 02/12/2018   LDLCALC  02/12/2018     Comment:     . LDL cholesterol not calculated. Triglyceride levels greater than 400 mg/dL invalidate calculated LDL results. . Reference range: <100 . Desirable range <100 mg/dL for primary prevention;   <70 mg/dL for patients with CHD or diabetic patients  with > or = 2 CHD risk factors. Marland Kitchen LDL-C is now calculated using the Martin-Hopkins  calculation, which is a validated novel method providing  better accuracy than the Friedewald equation in the  estimation of  LDL-C.  Horald Pollen et al. Lenox Ahr. 8413;244(01): 2061-2068  (http://education.[employer].com/faq/FAQ164)    LDLDIRECT 86 02/12/2018   TRIG 626 (H) 02/12/2018   CHOLHDL 6.6 (H) 02/12/2018      Assessment and Plan: 38 y.o. male with   .Malick was seen today for medication management.  Diagnoses and all orders for this visit:  Encounter for long-term (current) use of medications -     COMPLETE METABOLIC PANEL WITH GFR  Hypertriglyceridemia without hypercholesterolemia -     Icosapent Ethyl 1 g CAPS; Take 2 capsules (2 g total) by mouth 2 (two) times daily. -     Lipid Panel w/reflex Direct LDL  Gastroesophageal reflux disease, esophagitis  presence not specified -     pantoprazole (PROTONIX) 20 MG tablet; Take 1 tablet (20 mg total) by mouth daily.  Generalized anxiety disorder -     venlafaxine XR (EFFEXOR-XR) 75 MG 24 hr capsule; Take 1 capsule (75 mg total) by mouth daily with breakfast. Due for follow up visit w/PCP  Mass of left side of neck -     CBC with Differential/Platelet -     US SOFT TISSUE HEAD & NECK (NON-THYROID); Future  Cervical lymphadenopathy -     CBC with Differential/Platelet -     US SOFT TISSUE HEAD & NECK (NON-THYROID); Future  Elevated blood pressure reading -     COMPLETE METABOLIC PANEL WITH GFR  Alcohol induced fatty liver   GAD: GAD7=0, well controlled. Cont Effexor  Cervical lymphadenopathy/left-sided neck mass DDx includes infection (CMV, Mono, secondary syphilis, chronic sinusitis), benign neoplasm, and malignancy CBC w/diff pending US soft tissue neck pending  Elevated BP reading CMP pending Counseled on therapeutic lifestyle changes including DASH diet Patient to monitor and log readings at home  Patient education and anticipatory guidance given Patient agrees with treatment plan Follow-up in 6 weeks or sooner as needed if symptoms worsen or fail to improve  Levonne Hubert PA-C

## 2018-07-17 ENCOUNTER — Telehealth: Payer: Self-pay | Admitting: Physician Assistant

## 2018-07-17 DIAGNOSIS — R590 Localized enlarged lymph nodes: Secondary | ICD-10-CM

## 2018-07-17 DIAGNOSIS — R221 Localized swelling, mass and lump, neck: Secondary | ICD-10-CM

## 2018-07-17 NOTE — Telephone Encounter (Signed)
[place] order was cancelled. Is it OK to wait or would you like this changed to urgent? Routing.

## 2018-07-17 NOTE — Telephone Encounter (Signed)
Change to urgent

## 2018-07-17 NOTE — Telephone Encounter (Signed)
Order changed.

## 2018-07-22 ENCOUNTER — Encounter: Payer: Self-pay | Admitting: Physician Assistant

## 2018-07-22 DIAGNOSIS — R221 Localized swelling, mass and lump, neck: Secondary | ICD-10-CM | POA: Insufficient documentation

## 2018-07-22 DIAGNOSIS — R590 Localized enlarged lymph nodes: Secondary | ICD-10-CM | POA: Insufficient documentation

## 2018-10-06 ENCOUNTER — Other Ambulatory Visit: Payer: Self-pay | Admitting: Physician Assistant

## 2018-10-06 DIAGNOSIS — E781 Pure hyperglyceridemia: Secondary | ICD-10-CM

## 2018-11-04 ENCOUNTER — Other Ambulatory Visit: Payer: Self-pay | Admitting: Physician Assistant

## 2018-11-04 DIAGNOSIS — F411 Generalized anxiety disorder: Secondary | ICD-10-CM

## 2018-11-06 ENCOUNTER — Other Ambulatory Visit: Payer: Self-pay | Admitting: Physician Assistant

## 2018-11-06 DIAGNOSIS — K219 Gastro-esophageal reflux disease without esophagitis: Secondary | ICD-10-CM

## 2018-11-08 ENCOUNTER — Ambulatory Visit: Payer: BLUE CROSS/BLUE SHIELD | Admitting: Physician Assistant

## 2018-11-08 ENCOUNTER — Encounter: Payer: Self-pay | Admitting: Physician Assistant

## 2018-11-08 ENCOUNTER — Other Ambulatory Visit: Payer: Self-pay

## 2018-11-08 VITALS — BP 119/80 | HR 71 | Temp 98.1°F | Wt 268.0 lb

## 2018-11-08 DIAGNOSIS — Z800 Family history of malignant neoplasm of digestive organs: Secondary | ICD-10-CM

## 2018-11-08 DIAGNOSIS — K5900 Constipation, unspecified: Secondary | ICD-10-CM | POA: Diagnosis not present

## 2018-11-08 DIAGNOSIS — K700 Alcoholic fatty liver: Secondary | ICD-10-CM | POA: Diagnosis not present

## 2018-11-08 DIAGNOSIS — F411 Generalized anxiety disorder: Secondary | ICD-10-CM

## 2018-11-08 DIAGNOSIS — E781 Pure hyperglyceridemia: Secondary | ICD-10-CM | POA: Diagnosis not present

## 2018-11-08 DIAGNOSIS — R740 Nonspecific elevation of levels of transaminase and lactic acid dehydrogenase [LDH]: Secondary | ICD-10-CM | POA: Diagnosis not present

## 2018-11-08 DIAGNOSIS — R7401 Elevation of levels of liver transaminase levels: Secondary | ICD-10-CM

## 2018-11-08 MED ORDER — DOCUSATE SODIUM 100 MG PO CAPS: 100.0000 mg | ORAL_CAPSULE | Freq: Two times a day (BID) | ORAL | 3 refills | Status: DC

## 2018-11-08 MED ORDER — POLYETHYLENE GLYCOL 3350 17 GM/SCOOP PO POWD: ORAL | 1 refills | Status: DC

## 2018-11-08 MED ORDER — VENLAFAXINE HCL ER 75 MG PO CP24: 75.0000 mg | ORAL_CAPSULE | Freq: Every day | ORAL | 0 refills | Status: DC

## 2018-11-08 NOTE — Progress Notes (Signed)
HPI:                                                                Eduardo Parker is a 38 y.o. male who [facility] today for constipation  Patient with past medical history of alcohol induced fatty liver and GERD reports new onset constipation where he is only having a bowel movement approximately twice a week for the last 3 weeks.  Associated with decreased appetite, nausea, dry mouth, and fecal urgency.  States he had to take multiple doses of over-the-counter laxative above the recommended dose to have a bowel movement last night.  Denies any fever, chills, vomiting, abdominal pain, hematochezia, melena, tenesmus. He is not taking any opioids. He has reduced his alcohol consumption. Prior work-up: Right upper quadrant ultrasound 05-19-17 -probable fatty infiltration of liver, otherwise negative Family history significant for colon polyps in father and colon cancer in paternal grandfather   [facility] 11/08/2018 07/12/2018 02/12/2018 05/17/2017  Decreased Interest 2 1 1 1   Down, Depressed, Hopeless 0 1 1 1   PHQ - 2 Score 2 2 2 2   Altered sleeping 2 0 1 1  Tired, decreased energy 3 2 2 1   Change in appetite 1 0 1 2  Feeling bad or failure about yourself  0 0 1 2  Trouble concentrating 2 1 0 0  Moving slowly or fidgety/restless 0 0 0 -  Suicidal thoughts 0 0 0 0  PHQ-9 Score 10 5 7 8   Difficult doing work/chores - - Not difficult at all -    GAD 7 : Generalized Anxiety Score 07/12/2018 02/12/2018 05/17/2017  Nervous, Anxious, on Edge 0 1 0  Control/stop worrying 0 0 0  Worry too much - different things 0 0 0  Trouble relaxing 0 1 1  Restless 0 0 0  Easily annoyed or irritable 0 0 2  Afraid - awful might happen 0 0 0  Total GAD 7 Score 0 2 3  Anxiety Difficulty - Not difficult at all -      Past Medical History:  Diagnosis Date  . 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 Anxiety   .  Hypertriglyceridemia without hypercholesterolemia 04/29/2015   No past surgical history on file. Social History   Tobacco Use  . Smoking status: Never Smoker  . Smokeless tobacco: Never Used  Substance Use Topics  . Alcohol use: Yes   family history includes Breast cancer in his mother; Depression in his father and mother.    ROS: negative except as noted in the HPI  Medications: Current Outpatient Medications  Medication Sig Dispense Refill  . Multiple Vitamin (MULTIVITAMIN) capsule Take 1 capsule by mouth daily.    . pantoprazole (PROTONIX) 20 MG tablet Take 1 tablet (20 mg total) by mouth daily. Due for follow up visit w/PCP 30 tablet 0  . VASCEPA 1 g CAPS TAKE 2 CAPSULES (2 G TOTAL) BY MOUTH 2 (TWO) TIMES DAILY. 120 capsule 0  . venlafaxine XR (EFFEXOR-XR) 75 MG 24 hr capsule Take 1 capsule (75 mg total) by mouth daily with breakfast. Due for follow up visit w/PCP 90 capsule 0   No current facility-administered medications for this visit.    Allergies  Allergen Reactions  . Penicillins     childhood       Objective:  BP 119/80   Pulse 71   Temp 98.1 F (36.7 C) (Oral)   Wt 268 lb (121.6 kg)   BMI 31.78 kg/m  Gen:  alert, not ill-appearing, no distress, appropriate for age 64: head normocephalic without obvious abnormality, conjunctiva and cornea clear, trachea midline Pulm: Normal work of breathing, normal phonation, clear to auscultation bilaterally, no wheezes, rales or rhonchi CV: Normal rate, regular rhythm, s1 and s2 distinct, no murmurs, clicks or rubs GI: Bowel sounds active, abdomen soft, nondistended, no guarding or rigidity, negative Murphy sign Neuro: alert and oriented x 3, no tremor MSK: extremities atraumatic, normal gait and station Skin: intact, no rashes on exposed skin, no jaundice, no cyanosis Psych: well-groomed, cooperative, good eye contact, euthymic mood, affect mood-congruent, speech is articulate, and thought processes clear and  goal-directed    Assessment and Plan: 38 y.o. male with   .[name] was seen today for constipation.  Diagnoses and all orders for this visit:  Constipation, unspecified constipation type -     TSH + free T4 -     Hepatic function panel -     BASIC METABOLIC PANEL WITH GFR -     Lipase -     CBC with Differential/Platelet -     polyethylene glycol powder (GLYCOLAX/MIRALAX) 17 GM/SCOOP powder; Dissolve 17g in water. Drink 1-2 times daily until stooling normally and then take up to twice daily as needed for constipation -     docusate sodium (COLACE) 100 MG capsule; Take 1 capsule (100 mg total) by mouth 2 (two) times daily.  Generalized anxiety disorder -     venlafaxine XR (EFFEXOR-XR) 75 MG 24 hr capsule; Take 1 capsule (75 mg total) by mouth daily with breakfast.  Alcohol induced fatty liver -     Hepatic function panel -     CBC with Differential/Platelet  Hypertriglyceridemia without hypercholesterolemia -     Lipid Panel w/reflex Direct LDL  Elevated ALT measurement -     Hepatic function panel  Family history of colon cancer Comments: Paternal grandfather  Other orders -     DIRECT LDL   Acute constipation x 3 weeks Afebrile, no tachycardia, benign abdominal exam, he is stooling with use of OTC laxatives and last BM was last night, no evidence of bowel obstruction BMP, hepatic function, CBC pending Start MiraLAX 1-2 times daily until stooling normally.  Continue stool softeners twice a day Counseled on general measures for constipation   Patient education and anticipatory guidance given Patient agrees with treatment plan Follow-up in 2 weeks or sooner as needed if symptoms worsen or fail to improve  Darlyne Russian PA-C

## 2018-11-08 NOTE — Patient Instructions (Signed)

## 2018-11-09 ENCOUNTER — Encounter: Payer: Self-pay | Admitting: Physician Assistant

## 2018-11-09 ENCOUNTER — Telehealth: Payer: Self-pay | Admitting: Physician Assistant

## 2018-11-09 DIAGNOSIS — N179 Acute kidney failure, unspecified: Secondary | ICD-10-CM | POA: Diagnosis not present

## 2018-11-09 DIAGNOSIS — C8595 Non-Hodgkin lymphoma, unspecified, lymph nodes of inguinal region and lower limb: Secondary | ICD-10-CM | POA: Diagnosis not present

## 2018-11-09 DIAGNOSIS — Z1159 Encounter for screening for other viral diseases: Secondary | ICD-10-CM | POA: Diagnosis not present

## 2018-11-09 DIAGNOSIS — R1110 Vomiting, unspecified: Secondary | ICD-10-CM | POA: Diagnosis not present

## 2018-11-09 DIAGNOSIS — E876 Hypokalemia: Secondary | ICD-10-CM | POA: Diagnosis not present

## 2018-11-09 DIAGNOSIS — E869 Volume depletion, unspecified: Secondary | ICD-10-CM | POA: Diagnosis not present

## 2018-11-09 DIAGNOSIS — Z20828 Contact with and (suspected) exposure to other viral communicable diseases: Secondary | ICD-10-CM | POA: Diagnosis not present

## 2018-11-09 DIAGNOSIS — Z880 Allergy status to penicillin: Secondary | ICD-10-CM | POA: Diagnosis not present

## 2018-11-09 DIAGNOSIS — R590 Localized enlarged lymph nodes: Secondary | ICD-10-CM | POA: Diagnosis not present

## 2018-11-09 DIAGNOSIS — Z79899 Other long term (current) drug therapy: Secondary | ICD-10-CM | POA: Diagnosis not present

## 2018-11-09 DIAGNOSIS — E785 Hyperlipidemia, unspecified: Secondary | ICD-10-CM | POA: Diagnosis not present

## 2018-11-09 DIAGNOSIS — R591 Generalized enlarged lymph nodes: Secondary | ICD-10-CM | POA: Diagnosis not present

## 2018-11-09 DIAGNOSIS — R161 Splenomegaly, not elsewhere classified: Secondary | ICD-10-CM | POA: Diagnosis not present

## 2018-11-09 DIAGNOSIS — F419 Anxiety disorder, unspecified: Secondary | ICD-10-CM | POA: Diagnosis not present

## 2018-11-09 DIAGNOSIS — C8590 Non-Hodgkin lymphoma, unspecified, unspecified site: Secondary | ICD-10-CM | POA: Diagnosis not present

## 2018-11-09 DIAGNOSIS — I4510 Unspecified right bundle-branch block: Secondary | ICD-10-CM | POA: Diagnosis not present

## 2018-11-09 DIAGNOSIS — R109 Unspecified abdominal pain: Secondary | ICD-10-CM | POA: Diagnosis not present

## 2018-11-09 DIAGNOSIS — R799 Abnormal finding of blood chemistry, unspecified: Secondary | ICD-10-CM | POA: Diagnosis not present

## 2018-11-09 DIAGNOSIS — N200 Calculus of kidney: Secondary | ICD-10-CM | POA: Diagnosis not present

## 2018-11-09 DIAGNOSIS — K760 Fatty (change of) liver, not elsewhere classified: Secondary | ICD-10-CM | POA: Diagnosis not present

## 2018-11-09 DIAGNOSIS — K5900 Constipation, unspecified: Secondary | ICD-10-CM | POA: Diagnosis not present

## 2018-11-09 DIAGNOSIS — T50995A Adverse effect of other drugs, medicaments and biological substances, initial encounter: Secondary | ICD-10-CM | POA: Diagnosis not present

## 2018-11-09 DIAGNOSIS — R599 Enlarged lymph nodes, unspecified: Secondary | ICD-10-CM | POA: Diagnosis not present

## 2018-11-09 LAB — BASIC METABOLIC PANEL WITH GFR
BUN/Creatinine Ratio: 8 (calc) (ref 6–22)
BUN: 25 mg/dL (ref 7–25)
CO2: 23 mmol/L (ref 20–32)
Calcium: >15 mg/dL (ref 8.6–10.3)
Chloride: 104 mmol/L (ref 98–110)
Creat: 3.04 mg/dL — ABNORMAL HIGH (ref 0.60–1.35)
GFR, Est African American: 29 mL/min/{1.73_m2} — ABNORMAL LOW (ref >=60)
GFR, Est Non African American: 25 mL/min/{1.73_m2} — ABNORMAL LOW (ref >=60)
Glucose, Bld: 84 mg/dL (ref 65–99)
Potassium: 4 mmol/L (ref 3.5–5.3)
Sodium: 137 mmol/L (ref 135–146)

## 2018-11-09 LAB — CBC WITH DIFFERENTIAL/PLATELET
Absolute Monocytes: 642 cells/uL (ref 200–950)
Basophils Absolute: 83 cells/uL (ref 0–200)
Basophils Relative: 1.2 %
Eosinophils Absolute: 469 cells/uL (ref 15–500)
Eosinophils Relative: 6.8 %
HCT: 43.3 % (ref 38.5–50.0)
Hemoglobin: 15.4 g/dL (ref 13.2–17.1)
Lymphs Abs: 518 cells/uL — ABNORMAL LOW (ref 850–3900)
MCH: 31.4 pg (ref 27.0–33.0)
MCHC: 35.6 g/dL (ref 32.0–36.0)
MCV: 88.4 fL (ref 80.0–100.0)
MPV: 11.3 fL (ref 7.5–12.5)
Monocytes Relative: 9.3 %
Neutro Abs: 5189 cells/uL (ref 1500–7800)
Neutrophils Relative %: 75.2 %
Platelets: 317 10*3/uL (ref 140–400)
RBC: 4.9 10*6/uL (ref 4.20–5.80)
RDW: 12 % (ref 11.0–15.0)
Total Lymphocyte: 7.5 %
WBC: 6.9 10*3/uL (ref 3.8–10.8)

## 2018-11-09 LAB — LIPID PANEL W/REFLEX DIRECT LDL
Cholesterol: 155 mg/dL (ref <200)
HDL: 20 mg/dL — ABNORMAL LOW (ref >=40)
Non-HDL Cholesterol (Calc): 135 mg/dL (calc) — ABNORMAL HIGH (ref <130)
Total CHOL/HDL Ratio: 7.8 (calc) — ABNORMAL HIGH (ref <5.0)
Triglycerides: 402 mg/dL — ABNORMAL HIGH (ref <150)

## 2018-11-09 LAB — HEPATIC FUNCTION PANEL
AG Ratio: 1.5 (calc) (ref 1.0–2.5)
ALT: 29 U/L (ref 9–46)
AST: 27 U/L (ref 10–40)
Albumin: 4.4 g/dL (ref 3.6–5.1)
Alkaline phosphatase (APISO): 119 U/L (ref 36–130)
Bilirubin, Direct: 0.2 mg/dL (ref 0.0–0.2)
Globulin: 2.9 g/dL (calc) (ref 1.9–3.7)
Indirect Bilirubin: 0.7 mg/dL (calc) (ref 0.2–1.2)
Total Bilirubin: 0.9 mg/dL (ref 0.2–1.2)
Total Protein: 7.3 g/dL (ref 6.1–8.1)

## 2018-11-09 LAB — LIPASE: Lipase: 43 U/L (ref 7–60)

## 2018-11-09 LAB — DIRECT LDL: Direct LDL: 53 mg/dL (ref <100)

## 2018-11-09 LAB — TSH+FREE T4: TSH W/REFLEX TO FT4: 2.37 mIU/L (ref 0.40–4.50)

## 2018-11-09 NOTE — Telephone Encounter (Signed)
Please call patient Patient has acute kidney injury and needs to go to the emergency department asap

## 2018-11-09 NOTE — Telephone Encounter (Signed)
Patient advised of recommendations. He agreed to go to the hospital. He is going to go to [facility].

## 2018-11-11 ENCOUNTER — Encounter: Payer: Self-pay | Admitting: Physician Assistant

## 2018-11-11 DIAGNOSIS — K5900 Constipation, unspecified: Secondary | ICD-10-CM | POA: Insufficient documentation

## 2018-11-11 DIAGNOSIS — Z800 Family history of malignant neoplasm of digestive organs: Secondary | ICD-10-CM | POA: Insufficient documentation

## 2018-11-11 MED ORDER — GENERIC EXTERNAL MEDICATION
Status: DC
Start: ? — End: 2018-11-11

## 2018-11-11 MED ORDER — SODIUM CHLORIDE 0.9 % IV SOLN
75.00 | INTRAVENOUS | Status: DC
Start: ? — End: 2018-11-11

## 2018-11-11 MED ORDER — VENLAFAXINE HCL ER 75 MG PO CP24
75.00 | ORAL_CAPSULE | ORAL | Status: DC
Start: 2018-11-14 — End: 2018-11-11

## 2018-11-11 MED ORDER — ACETAMINOPHEN 325 MG PO TABS
650.00 | ORAL_TABLET | ORAL | Status: DC
Start: ? — End: 2018-11-11

## 2018-11-11 MED ORDER — NITROGLYCERIN 0.4 MG SL SUBL
0.40 | SUBLINGUAL_TABLET | SUBLINGUAL | Status: DC
Start: ? — End: 2018-11-11

## 2018-11-11 MED ORDER — ALLOPURINOL 100 MG PO TABS
300.00 | ORAL_TABLET | ORAL | Status: DC
Start: 2018-11-14 — End: 2018-11-11

## 2018-11-11 MED ORDER — ALUM & MAG HYDROXIDE-SIMETH 200-200-20 MG/5ML PO SUSP
30.00 | ORAL | Status: DC
Start: ? — End: 2018-11-11

## 2018-11-11 MED ORDER — SODIUM CHLORIDE 0.9 % IV SOLN
10.00 | INTRAVENOUS | Status: DC
Start: ? — End: 2018-11-11

## 2018-11-11 MED ORDER — HEPARIN SODIUM (PORCINE) 5000 UNIT/ML IJ SOLN
5000.00 | INTRAMUSCULAR | Status: DC
Start: 2018-11-13 — End: 2018-11-11

## 2018-11-14 MED ORDER — GENERIC EXTERNAL MEDICATION
Status: DC
Start: ? — End: 2018-11-14

## 2018-11-15 DIAGNOSIS — I881 Chronic lymphadenitis, except mesenteric: Secondary | ICD-10-CM | POA: Diagnosis not present

## 2018-11-15 DIAGNOSIS — D861 Sarcoidosis of lymph nodes: Secondary | ICD-10-CM | POA: Diagnosis not present

## 2018-11-19 ENCOUNTER — Encounter: Payer: Self-pay | Admitting: Physician Assistant

## 2018-11-19 DIAGNOSIS — I881 Chronic lymphadenitis, except mesenteric: Secondary | ICD-10-CM | POA: Insufficient documentation

## 2018-11-19 DIAGNOSIS — I888 Other nonspecific lymphadenitis: Secondary | ICD-10-CM | POA: Insufficient documentation

## 2018-11-19 HISTORY — DX: Other nonspecific lymphadenitis: I88.8

## 2018-11-21 DIAGNOSIS — D861 Sarcoidosis of lymph nodes: Secondary | ICD-10-CM | POA: Diagnosis not present

## 2018-11-21 DIAGNOSIS — I881 Chronic lymphadenitis, except mesenteric: Secondary | ICD-10-CM | POA: Diagnosis not present

## 2018-11-22 ENCOUNTER — Ambulatory Visit: Payer: BC Managed Care – PPO | Admitting: Physician Assistant

## 2018-12-12 DIAGNOSIS — I881 Chronic lymphadenitis, except mesenteric: Secondary | ICD-10-CM | POA: Diagnosis not present

## 2018-12-12 DIAGNOSIS — D861 Sarcoidosis of lymph nodes: Secondary | ICD-10-CM | POA: Diagnosis not present

## 2019-01-02 DIAGNOSIS — D861 Sarcoidosis of lymph nodes: Secondary | ICD-10-CM | POA: Diagnosis not present

## 2019-01-02 DIAGNOSIS — Z79899 Other long term (current) drug therapy: Secondary | ICD-10-CM | POA: Diagnosis not present

## 2019-01-02 DIAGNOSIS — I10 Essential (primary) hypertension: Secondary | ICD-10-CM | POA: Diagnosis not present

## 2019-01-02 DIAGNOSIS — I881 Chronic lymphadenitis, except mesenteric: Secondary | ICD-10-CM | POA: Diagnosis not present

## 2019-01-02 DIAGNOSIS — K7010 Alcoholic hepatitis without ascites: Secondary | ICD-10-CM | POA: Diagnosis not present

## 2019-02-25 ENCOUNTER — Ambulatory Visit (INDEPENDENT_AMBULATORY_CARE_PROVIDER_SITE_OTHER): Payer: BC Managed Care – PPO | Admitting: Family Medicine

## 2019-02-25 ENCOUNTER — Encounter: Payer: Self-pay | Admitting: Family Medicine

## 2019-02-25 DIAGNOSIS — K700 Alcoholic fatty liver: Secondary | ICD-10-CM

## 2019-02-25 DIAGNOSIS — F411 Generalized anxiety disorder: Secondary | ICD-10-CM

## 2019-02-25 DIAGNOSIS — F1090 Alcohol use, unspecified, uncomplicated: Secondary | ICD-10-CM

## 2019-02-25 DIAGNOSIS — Z789 Other specified health status: Secondary | ICD-10-CM | POA: Diagnosis not present

## 2019-02-25 DIAGNOSIS — E781 Pure hyperglyceridemia: Secondary | ICD-10-CM

## 2019-02-25 DIAGNOSIS — K2100 Gastro-esophageal reflux disease with esophagitis, without bleeding: Secondary | ICD-10-CM | POA: Diagnosis not present

## 2019-02-25 MED ORDER — VASCEPA 1 G PO CAPS: ORAL_CAPSULE | ORAL | 3 refills | Status: DC

## 2019-02-25 MED ORDER — VENLAFAXINE HCL ER 150 MG PO CP24: 150.0000 mg | ORAL_CAPSULE | Freq: Every day | ORAL | 0 refills | Status: DC

## 2019-02-25 NOTE — Assessment & Plan Note (Signed)
Recommend check liver enzymes Q 6 mo

## 2019-02-25 NOTE — Progress Notes (Signed)
Virtual Visit via Video Note  I connected with Eduardo Parker on 02/25/19 at  1:40 PM EST by a video enabled telemedicine application and verified that I am speaking with the correct person using two identifiers.   I discussed the limitations of evaluation and management by telemedicine and the availability of in person appointments. The patient expressed understanding and agreed to proceed.     Established Patient Office Visit  Subjective:  Patient ID: Eduardo Parker, male    DOB: [birth year]  Age: 38 y.o. MRN: [id number]  CC:  Chief Complaint  Patient presents with  . mood    HPI Eduardo Parker presents for  Follow-up generalized anxiety disorder-he is currently on Effexor 75 mg daily.  He is not having any problems with the medication he feels like it is effective overall but does wonder if he might benefit from going up just slightly.  He has been able to work during the pandemic.  He reports that sleep is okay overall sometimes will have some restless nights.  Stress levels are up and down nothing consistent.  GERD-he is actually doing really well with his reflux so does not need a refill of the pantoprazole right now.  He says he will just call if he needs another refill anytime soon.  Hypertriglyceridemia-doing well on the Vascepa.  He does need a refill on that prescription.  Last lipid panel was done in July.  He also wanted to let me know he was seen for acute kidney injury and lymphadenopathy on July 2020.  He has been followed by hematology and they have been monitoring his renal function since then.   Past Medical History:  Diagnosis Date  . 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 Anxiety   . 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 Hypertriglyceridemia without hypercholesterolemia 04/29/2015    No past surgical history on file.  Family History  Problem Relation Age of Onset  . Breast cancer Mother   . Depression Mother   . Depression Father   . Colon  polyps Father   . Colon cancer Paternal Grandfather     Social History   Socioeconomic History  . Marital status: Single    Spouse name: Not on file  . Number of children: Not on file  . Years of education: Not on file  . Highest education level: Not on file  Occupational History  . Not on file  Social Needs  . Financial resource strain: Not on file  . Food insecurity    Worry: Not on file    Inability: Not on file  . Transportation needs    Medical: Not on file    Non-medical: Not on file  Tobacco Use  . Smoking status: Never Smoker  . Smokeless tobacco: Never Used  Substance and Sexual Activity  . Alcohol use: Yes  . Drug use: No  . Sexual activity: Yes    Birth control/protection: None  Lifestyle  . Physical activity    Days per week: Not on file    Minutes per session: Not on file  . Stress: Not on file  Relationships  . Social Musician on phone: Not on file    Gets together: Not on file    Attends religious service: Not on file    Active member of club or organization: Not on file    Attends meetings of clubs or organizations: Not on file    Relationship status: Not on file  . Intimate  partner violence    Fear of current or ex partner: Not on file    Emotionally abused: Not on file    Physically abused: Not on file    Forced sexual activity: Not on file  Other Topics Concern  . Not on file  Social History Narrative  . Not on file    Outpatient Medications Prior to Visit  Medication Sig Dispense Refill  . docusate sodium (COLACE) 100 MG capsule Take 1 capsule (100 mg total) by mouth 2 (two) times daily. 90 capsule 3  . Multiple Vitamin (MULTIVITAMIN) capsule Take 1 capsule by mouth daily.    . pantoprazole (PROTONIX) 20 MG tablet Take 1 tablet (20 mg total) by mouth daily. Due for follow up visit w/PCP 30 tablet 0  . polyethylene glycol powder (GLYCOLAX/MIRALAX) 17 GM/SCOOP powder Dissolve 17g in water. Drink 1-2 times daily until stooling  normally and then take up to twice daily as needed for constipation 3350 g 1  . VASCEPA 1 g CAPS TAKE 2 CAPSULES (2 G TOTAL) BY MOUTH 2 (TWO) TIMES DAILY. 120 capsule 0  . venlafaxine XR (EFFEXOR-XR) 75 MG 24 hr capsule Take 1 capsule (75 mg total) by mouth daily with breakfast. 90 capsule 0   No facility-administered medications prior to visit.     Allergies  Allergen Reactions  . Penicillins     childhood    ROS Review of Systems    Objective:    Physical Exam  Ht 6\' 5"  (1.956 m)   BMI 31.78 kg/m  Wt Readings from Last 3 Encounters:  11/08/18 268 lb (121.6 kg)  07/12/18 286 lb (129.7 kg)  02/12/18 276 lb (125.2 kg)     There are no preventive care reminders to display for this patient.  There are no preventive care reminders to display for this patient.  No results found for: TSH Lab Results  Component Value Date   WBC 6.9 11/08/2018   HGB 15.4 11/08/2018   HCT 43.3 11/08/2018   MCV 88.4 11/08/2018   PLT 317 11/08/2018   Lab Results  Component Value Date   NA 137 11/08/2018   K 4.0 11/08/2018   CO2 23 11/08/2018   GLUCOSE 84 11/08/2018   BUN 25 11/08/2018   CREATININE 3.04 (H) 11/08/2018   BILITOT 0.9 11/08/2018   ALKPHOS 81 08/22/2016   AST 27 11/08/2018   ALT 29 11/08/2018   PROT 7.3 11/08/2018   ALBUMIN 4.3 08/22/2016   ALBUMIN 4.3 08/22/2016   CALCIUM >15.0 (HH) 11/08/2018   Lab Results  Component Value Date   CHOL 155 11/08/2018   Lab Results  Component Value Date   HDL 20 (L) 11/08/2018   Lab Results  Component Value Date   [facility]  11/08/2018     Comment:     . LDL cholesterol not calculated. Triglyceride levels greater than 400 mg/dL invalidate calculated LDL results. . Reference range: <100 . Desirable range <100 mg/dL for primary prevention;   <70 mg/dL for patients with CHD or diabetic patients  with > or = 2 CHD risk factors. Marland Kitchen. LDL-C is now calculated using the Martin-Hopkins  calculation, which is a validated novel  method providing  better accuracy than the Friedewald equation in the  estimation of LDL-C.  Horald PollenMartin SS et al. Lenox AhrJAMA. 8295;621(302013;310(19): 2061-2068  (http://education.[facility].com/faq/FAQ164)    Lab Results  Component Value Date   TRIG 402 (H) 11/08/2018   Lab Results  Component Value Date   CHOLHDL 7.8 (H) 11/08/2018  No results found for: HGBA1C    Assessment & Plan:   Problem List Items Addressed This Visit      Digestive   Gastroesophageal reflux disease with esophagitis    Improved.  Now just using pantoprazole as needed.      Alcohol induced fatty liver    Recommend check liver enzymes Q 6 mo        Other   Hypertriglyceridemia without hypercholesterolemia (Chronic)   Relevant Medications   Icosapent Ethyl (VASCEPA) 1 g CAPS   Heavy alcohol consumption    Did encourage him to minimize alcohol intake as well as Tylenol products because of his fatty liver disease.      Generalized anxiety disorder (Chronic)    Doing well on current regimen would like to try an increased dose.  Will increase Effexor 150 mg for 1 month.  If that is helpful then he will send me a note back and we can send in a refill for 90 days.  If not he would prefer to go back down to the 75 mg he can let me know and we will send in a 90-day prescription for that.      Relevant Medications   venlafaxine XR (EFFEXOR-XR) 150 MG 24 hr capsule      Meds ordered this encounter  Medications  . Icosapent Ethyl (VASCEPA) 1 g CAPS    Sig: TAKE 2 CAPSULES (2 G TOTAL) BY MOUTH 2 (TWO) TIMES DAILY.    Dispense:  360 capsule    Refill:  3    DX Code Needed  .  Marland Kitchen venlafaxine XR (EFFEXOR-XR) 150 MG 24 hr capsule    Sig: Take 1 capsule (150 mg total) by mouth daily with breakfast.    Dispense:  30 capsule    Refill:  0    Follow-up: Return in about 4 months (around 06/25/2019) for Mood and due for labs for liver monitorin.     I discussed the assessment and treatment plan with the patient. The  patient was provided an opportunity to ask questions and all were answered. The patient agreed with the plan and demonstrated an understanding of the instructions.   The patient was advised to call back or seek an in-person evaluation if the symptoms worsen or if the condition fails to improve as anticipated.    Beatrice Lecher, MD

## 2019-02-25 NOTE — Assessment & Plan Note (Signed)
Did encourage him to minimize alcohol intake as well as Tylenol products because of his fatty liver disease.

## 2019-02-25 NOTE — Assessment & Plan Note (Signed)
Doing well on current regimen would like to try an increased dose.  Will increase Effexor 150 mg for 1 month.  If that is helpful then he will send me a note back and we can send in a refill for 90 days.  If not he would prefer to go back down to the 75 mg he can let me know and we will send in a 90-day prescription for that.

## 2019-02-25 NOTE — Assessment & Plan Note (Signed)
Improved.  Now just using pantoprazole as needed.

## 2019-02-25 NOTE — Progress Notes (Signed)
lvm advising pt that I was calling to obtain his VS and go over medications, and do his phq -9 and gad-7.Eduardo Parker, [name], CMA   Tried calling pt again the line is busy. Tried several times same outcome.[name], [place]

## 2019-03-18 DIAGNOSIS — Z79899 Other long term (current) drug therapy: Secondary | ICD-10-CM | POA: Diagnosis not present

## 2019-03-18 DIAGNOSIS — D861 Sarcoidosis of lymph nodes: Secondary | ICD-10-CM | POA: Diagnosis not present

## 2019-03-18 DIAGNOSIS — K7010 Alcoholic hepatitis without ascites: Secondary | ICD-10-CM | POA: Diagnosis not present

## 2019-03-18 DIAGNOSIS — I881 Chronic lymphadenitis, except mesenteric: Secondary | ICD-10-CM | POA: Diagnosis not present

## 2019-03-18 DIAGNOSIS — I889 Nonspecific lymphadenitis, unspecified: Secondary | ICD-10-CM | POA: Diagnosis not present

## 2019-03-21 ENCOUNTER — Other Ambulatory Visit: Payer: Self-pay | Admitting: Family Medicine

## 2019-03-21 DIAGNOSIS — F411 Generalized anxiety disorder: Secondary | ICD-10-CM

## 2019-04-09 DIAGNOSIS — H52222 Regular astigmatism, left eye: Secondary | ICD-10-CM | POA: Diagnosis not present

## 2019-04-11 DIAGNOSIS — H35372 Puckering of macula, left eye: Secondary | ICD-10-CM | POA: Diagnosis not present

## 2019-04-11 DIAGNOSIS — D8683 Sarcoid iridocyclitis: Secondary | ICD-10-CM | POA: Diagnosis not present

## 2019-04-11 DIAGNOSIS — H43821 Vitreomacular adhesion, right eye: Secondary | ICD-10-CM | POA: Diagnosis not present

## 2019-05-20 DIAGNOSIS — H30033 Focal chorioretinal inflammation, peripheral, bilateral: Secondary | ICD-10-CM | POA: Diagnosis not present

## 2019-05-20 DIAGNOSIS — H3581 Retinal edema: Secondary | ICD-10-CM | POA: Diagnosis not present

## 2019-05-24 DIAGNOSIS — H30033 Focal chorioretinal inflammation, peripheral, bilateral: Secondary | ICD-10-CM | POA: Diagnosis not present

## 2019-06-26 ENCOUNTER — Encounter: Payer: Self-pay | Admitting: Family Medicine

## 2019-06-26 ENCOUNTER — Telehealth (INDEPENDENT_AMBULATORY_CARE_PROVIDER_SITE_OTHER): Payer: BC Managed Care – PPO | Admitting: Family Medicine

## 2019-06-26 VITALS — Temp 97.8°F | Ht 77.0 in | Wt 280.0 lb

## 2019-06-26 DIAGNOSIS — F411 Generalized anxiety disorder: Secondary | ICD-10-CM

## 2019-06-26 DIAGNOSIS — F1090 Alcohol use, unspecified, uncomplicated: Secondary | ICD-10-CM

## 2019-06-26 DIAGNOSIS — E781 Pure hyperglyceridemia: Secondary | ICD-10-CM | POA: Diagnosis not present

## 2019-06-26 DIAGNOSIS — K700 Alcoholic fatty liver: Secondary | ICD-10-CM

## 2019-06-26 DIAGNOSIS — Z789 Other specified health status: Secondary | ICD-10-CM | POA: Diagnosis not present

## 2019-06-26 MED ORDER — VENLAFAXINE HCL ER 75 MG PO CP24: 75.0000 mg | ORAL_CAPSULE | Freq: Every day | ORAL | 1 refills | Status: DC

## 2019-06-26 NOTE — Progress Notes (Signed)
Patient reports the 150 mg is not doing any better and wants to go down to the 75 mg.

## 2019-06-26 NOTE — Assessment & Plan Note (Addendum)
Discussed recommendations for reduced EtOH intake.  History of alcoholic fatty liver disease, update LFT's He will continue to work on cutting back.

## 2019-06-26 NOTE — Assessment & Plan Note (Signed)
Will reduce effexor to to 75mg  as he hasn't really noticed benefit at higher dose.  No follow-ups on file.

## 2019-06-26 NOTE — Progress Notes (Signed)
Eduardo Parker - 39 y.o. male MRN [id number]  Date of birth: May 27, 1980   This visit type was conducted due to national recommendations for restrictions regarding the COVID-19 Pandemic (e.g. social distancing).  This format is felt to be most appropriate for this patient at this time.  All issues noted in this document were discussed and addressed.  No physical exam was performed (except for noted visual exam findings with Video Visits).  I discussed the limitations of evaluation and management by telemedicine and the availability of in person appointments. The patient expressed understanding and agreed to proceed.  I connected with@ on 06/26/19 at  2:40 PM EST by a video enabled telemedicine application and verified that I am speaking with the correct person using two identifiers.  Present at visit: Everrett Coombe, DO [facility]   Patient Location: Home [address] [place] [address]   Provider location:   [facility]  Chief Complaint  Patient presents with  . Follow-up    HPI  NAI DASCH is a 39 y.o. male who presents via audio/video conferencing for a telehealth visit today.  He is following up today for anxiety.  He also has a history of alcohol dependence with alcohol induced fatty liver and hypertriglyceridemia.  He was last seen by Dr. Linford Arnold on 02/2019.  Effexor increased to 150mg  however he hasn't really noticed a difference with this change in dose.  He would like to decrease back to 75mg .    He has stopped taking vascepa for triglycerides.  He reports that he has cut back on EtOH intake but still drinks "more than he should".     ROS:  A comprehensive ROS was completed and negative except as noted per HPI  Past Medical History:  Diagnosis Date  . 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 Anxiety   . 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 Hypertriglyceridemia without hypercholesterolemia 04/29/2015    History reviewed. No pertinent surgical  history.  Family History  Problem Relation Age of Onset  . Breast cancer Mother   . Depression Mother   . Depression Father   . Colon polyps Father   . Colon cancer Paternal Grandfather     Social History   Socioeconomic History  . Marital status: Single    Spouse name: Not on file  . Number of children: Not on file  . Years of education: Not on file  . Highest education level: Not on file  Occupational History  . Not on file  Tobacco Use  . Smoking status: Never Smoker  . Smokeless tobacco: Never Used  Substance and Sexual Activity  . Alcohol use: Yes  . Drug use: No  . Sexual activity: Yes    Birth control/protection: None  Other Topics Concern  . Not on file  Social History Narrative  . Not on file   Social Determinants of Health   Financial Resource Strain:   . Difficulty of Paying Living Expenses: Not on file  Food Insecurity:   . Worried About 11/21/2018 in the Last Year: Not on file  . Ran Out of Food in the Last Year: Not on file  Transportation Needs:   . Lack of Transportation (Medical): Not on file  . Lack of Transportation (Non-Medical): Not on file  Physical Activity:   . Days of Exercise per Week: Not on file  . Minutes of Exercise per Session: Not on file  Stress:   . Feeling of Stress : Not on file  Social Connections:   . Frequency of Communication with Friends and Family: Not on file  . Frequency of Social Gatherings with Friends and Family: Not on file  . Attends Religious Services: Not on file  . Active Member of Clubs or Organizations: Not on file  . Attends Archivist Meetings: Not on file  . Marital Status: Not on file  Intimate Partner Violence:   . Fear of Current or Ex-Partner: Not on file  . Emotionally Abused: Not on file  . Physically Abused: Not on file  . Sexually Abused: Not on file     Current Outpatient Medications:  .  folic acid (FOLVITE) 1 MG tablet, Take by mouth., Disp: , Rfl:  .  Multiple  Vitamin (MULTIVITAMIN) capsule, Take 1 capsule by mouth daily., Disp: , Rfl:  .  pantoprazole (PROTONIX) 20 MG tablet, Take 1 tablet (20 mg total) by mouth daily. Due for follow up visit w/PCP, Disp: 30 tablet, Rfl: 0 .  venlafaxine XR (EFFEXOR-XR) 75 MG 24 hr capsule, Take 1 capsule (75 mg total) by mouth daily with breakfast., Disp: 90 capsule, Rfl: 1  EXAM:  VITALS per patient if applicable: Temp 20.9 F (36.6 C) (Oral)   Ht 6\' 5"  (1.956 m)   Wt 280 lb (127 kg)   BMI 33.20 kg/m   GENERAL: alert, oriented, appears well and in no acute distress  HEENT: atraumatic, conjunttiva clear, no obvious abnormalities on inspection of external nose and ears  NECK: normal movements of the head and neck  LUNGS: on inspection no signs of respiratory distress, breathing rate appears normal, no obvious gross SOB, gasping or wheezing  CV: no obvious cyanosis  MS: moves all visible extremities without noticeable abnormality  PSYCH/NEURO: pleasant and cooperative, no obvious depression or anxiety, speech and thought processing grossly intact  ASSESSMENT AND PLAN:  Discussed the following assessment and plan:  Hypertriglyceridemia without hypercholesterolemia He has stopped taking vascepa, wants to see what labs look like off of this.  Repeat lipid panel.  Discussed low fat diet and limiting EtOH intake.   Heavy alcohol consumption Discussed recommendations for reduced EtOH intake.  History of alcoholic fatty liver disease, update LFT's He will continue to work on cutting back.   Generalized anxiety disorder Will reduce effexor to to 75mg  as he hasn't really noticed benefit at higher dose.  No follow-ups on file.   Meds ordered this encounter  Medications  . venlafaxine XR (EFFEXOR-XR) 75 MG 24 hr capsule    Sig: Take 1 capsule (75 mg total) by mouth daily with breakfast.    Dispense:  90 capsule    Refill:  1   30 minutes spent including pre visit preparation, review of prior notes  and labs, encounter with patient via video visit and same day documentation.    I discussed the assessment and treatment plan with the patient. The patient was provided an opportunity to ask questions and all were answered. The patient agreed with the plan and demonstrated an understanding of the instructions.   The patient was advised to call back or seek an in-person evaluation if the symptoms worsen or if the condition fails to improve as anticipated.    Luetta Nutting, DO

## 2019-06-26 NOTE — Assessment & Plan Note (Signed)
He has stopped taking vascepa, wants to see what labs look like off of this.  Repeat lipid panel.  Discussed low fat diet and limiting EtOH intake.

## 2019-06-27 DIAGNOSIS — H3581 Retinal edema: Secondary | ICD-10-CM | POA: Diagnosis not present

## 2019-06-27 DIAGNOSIS — Z006 Encounter for examination for normal comparison and control in clinical research program: Secondary | ICD-10-CM | POA: Diagnosis not present

## 2019-06-27 DIAGNOSIS — H30033 Focal chorioretinal inflammation, peripheral, bilateral: Secondary | ICD-10-CM | POA: Diagnosis not present

## 2019-09-11 ENCOUNTER — Ambulatory Visit (INDEPENDENT_AMBULATORY_CARE_PROVIDER_SITE_OTHER): Payer: BC Managed Care – PPO | Admitting: Family Medicine

## 2019-09-11 ENCOUNTER — Encounter: Payer: Self-pay | Admitting: Family Medicine

## 2019-09-11 VITALS — BP 162/88 | HR 71 | Temp 97.5°F | Ht 77.17 in | Wt 299.3 lb

## 2019-09-11 DIAGNOSIS — F1020 Alcohol dependence, uncomplicated: Secondary | ICD-10-CM

## 2019-09-11 DIAGNOSIS — Z0000 Encounter for general adult medical examination without abnormal findings: Secondary | ICD-10-CM | POA: Diagnosis not present

## 2019-09-11 DIAGNOSIS — E781 Pure hyperglyceridemia: Secondary | ICD-10-CM

## 2019-09-11 DIAGNOSIS — F411 Generalized anxiety disorder: Secondary | ICD-10-CM | POA: Diagnosis not present

## 2019-09-11 MED ORDER — VENLAFAXINE HCL ER 75 MG PO CP24: 75.0000 mg | ORAL_CAPSULE | Freq: Every day | ORAL | 1 refills | Status: DC

## 2019-09-11 NOTE — Patient Instructions (Addendum)
Great to see you today! I would strongly recommend that you cut back on drinking.   Your blood pressure is elevated today.  Let's have you stop by in a couple of weeks to have this rechecked. If this remains high we'll need to start medication.      Alcohol Use Disorder Alcohol use disorder is when your drinking disrupts your daily life. When you have this condition, you drink too much alcohol and you cannot control your drinking. Alcohol use disorder can cause serious problems with your physical health. It can affect your brain, heart, liver, pancreas, immune system, stomach, and intestines. Alcohol use disorder can increase your risk for certain cancers and cause problems with your mental health, such as depression, anxiety, psychosis, delirium, and dementia. People with this disorder risk hurting themselves and others. What are the causes? This condition is caused by drinking too much alcohol over time. It is not caused by drinking too much alcohol only one or two times. Some people with this condition drink alcohol to cope with or escape from negative life events. Others drink to relieve pain or symptoms of mental illness. What increases the risk? You are more likely to develop this condition if:  You have a family history of alcohol use disorder.  Your culture encourages drinking to the point of intoxication, or makes alcohol easy to get.  You had a mood or conduct disorder in childhood.  You have been a victim of abuse.  You are an adolescent and: ? You have poor grades or difficulties in school. ? Your caregivers do not talk to you about saying no to alcohol, or supervise your activities. ? You are impulsive or you have trouble with self-control. What are the signs or symptoms? Symptoms of this condition include:  Drinkingmore than you want to.  Drinking for longer than you want to.  Trying several times to drink less or to control your drinking.  Spending a lot of time  getting alcohol, drinking, or recovering from drinking.  Craving alcohol.  Having problems at work, at school, or at home due to drinking.  Having problems in relationships due to drinking.  Drinking when it is dangerous to drink, such as before driving a car.  Continuing to drink even though you know you might have a physical or mental problem related to drinking.  Needing more and more alcohol to get the same effect you want from the alcohol (building up tolerance).  Having symptoms of withdrawal when you stop drinking. Symptoms of withdrawal include: ? Fatigue. ? Nightmares. ? Trouble sleeping. ? Depression. ? Anxiety. ? Fever. ? Seizures. ? Severe confusion. ? Feeling or seeing things that are not there (hallucinations). ? Tremors. ? Rapid heart rate. ? Rapid breathing. ? High blood pressure.  Drinking to avoid symptoms of withdrawal. How is this diagnosed? This condition is diagnosed with an assessment. Your health care provider may start the assessment by asking three or four questions about your drinking. Your health care provider may perform a physical exam or do lab tests to see if you have physical problems resulting from alcohol use. She or he may refer you to a mental health professional for evaluation. How is this treated? Some people with alcohol use disorder are able to reduce their alcohol use to low-risk levels. Others need to completely quit drinking alcohol. When necessary, mental health professionals with specialized training in substance use treatment can help. Your health care provider can help you decide how severe your alcohol  use disorder is and what type of treatment you need. The following forms of treatment are available:  Detoxification. Detoxification involves quitting drinking and using prescription medicines within the first week to help lessen withdrawal symptoms. This treatment is important for people who have had withdrawal symptoms before and  for heavy drinkers who are likely to have withdrawal symptoms. Alcohol withdrawal can be dangerous, and in severe cases, it can cause death. Detoxification may be provided in a home, community, or primary care setting, or in a hospital or substance use treatment facility.  Counseling. This treatment is also called talk therapy. It is provided by substance use treatment counselors. A counselor can address the reasons you use alcohol and suggest ways to keep you from drinking again or to prevent problem drinking. The goals of talk therapy are to: ? Find healthy activities and ways for you to cope with stress. ? Identify and avoid the things that trigger your alcohol use. ? Help you learn how to handle cravings.  Medicines.Medicines can help treat alcohol use disorder by: ? Decreasing alcohol cravings. ? Decreasing the positive feeling you have when you drink alcohol. ? Causing an uncomfortable physical reaction when you drink alcohol (aversion therapy).  Support groups. Support groups are led by people who have quit drinking. They provide emotional support, advice, and guidance. These forms of treatment are often combined. Some people with this condition benefit from a combination of treatments provided by specialized substance use treatment centers. Follow these instructions at home:  Take over-the-counter and prescription medicines only as told by your health care provider.  Check with your health care provider before starting any new medicines.  Ask friends and family members not to offer you alcohol.  Avoid situations where alcohol is served, including gatherings where others are drinking alcohol.  Create a plan for what to do when you are tempted to use alcohol.  Find hobbies or activities that you enjoy that do not include alcohol.  Keep all follow-up visits as told by your health care provider. This is important. How is this prevented?  If you drink, limit alcohol intake to no  more than 1 drink a day for nonpregnant women and 2 drinks a day for men. One drink equals 12 oz of beer, 5 oz of wine, or 1 oz of hard liquor.  If you have a mental health condition, get treatment and support.  Do not give alcohol to adolescents.  If you are an adolescent: ? Do not drink alcohol. ? Do not be afraid to say no if someone offers you alcohol. Speak up about why you do not want to drink. You can be a positive role model for your friends and set a good example for those around you by not drinking alcohol. ? If your friends drink, spend time with others who do not drink alcohol. Make new friends who do not use alcohol. ? Find healthy ways to manage stress and emotions, such as meditation or deep breathing, exercise, spending time in nature, listening to music, or talking with a trusted friend or family member. Contact a health care provider if:  You are not able to take your medicines as told.  Your symptoms get worse.  You return to drinking alcohol (relapse) and your symptoms get worse. Get help right away if:  You have thoughts about hurting yourself or others. If you ever feel like you may hurt yourself or others, or have thoughts about taking your own life, get help right away.  You can go to your nearest emergency department or call:  Your local emergency services (911 in the [place]  A suicide crisis helpline, such as the National Suicide Prevention Lifeline at 443-478-0437. This is open 24 hours a day. Summary  Alcohol use disorder is when your drinking disrupts your daily life. When you have this condition, you drink too much alcohol and you cannot control your drinking.  Treatment may include detoxification, counseling, medicine, and support groups.  Ask friends and family members not to offer you alcohol. Avoid situations where alcohol is served.  Get help right away if you have thoughts about hurting yourself or others. This information is not intended to  replace advice given to you by your health care provider. Make sure you discuss any questions you have with your health care provider. Document Revised: 03/24/2017 Document Reviewed: 01/07/2016 Elsevier Patient Education  2020 [employer].

## 2019-09-11 NOTE — Assessment & Plan Note (Signed)
Counseled extensively on reduction of alcohol use. Already had signs that this is affecting liver.  Also on MTX which I discussed with him can also affect liver.  Declines detox or alcohol treatment program.

## 2019-09-11 NOTE — Assessment & Plan Note (Signed)
Orders Placed This Encounter  Procedures  . Lipid Panel w/reflex Direct LDL  . COMPLETE METABOLIC PANEL WITH GFR  . CBC  . TSH  Screening: UTD Immunizations: UTD Anticipatory guidance/Risk factor reduction: Counseled on healthy diet and exercise.  Recommend decreased EtOH intake.

## 2019-09-11 NOTE — Progress Notes (Signed)
Eduardo Parker - 39 y.o. male MRN [id number]  Date of birth: November 25, 1980  Subjective Chief Complaint  Patient presents with  . Annual Exam    HPI Eduardo Parker is a 39 y.o. male with history of sarcoid, alcohol use disorder with associated fattly liver, anxiety and elevated TG here today for annual exam.   No new concerns today.   He is followed by heme/onc for sarcoid.  Current management with MTX.  Stable at this time.  Does have rash on his back but not really bothersome at this time.   He has increased his EtOH intake again recently.  He is currently consuming 6-10 beers each day.  Feels like anxiety is pretty well controlled with effexor, thinks it is more habit than anything at this point.  He is not interested in detox or alcohol treatment program.    He is a non-smoker.    Exercises occasionally. Admits diet could be better.   Review of Systems  Constitutional: Negative for chills, fever, malaise/fatigue and weight loss.  HENT: Negative for congestion, ear pain and sore throat.   Eyes: Negative for blurred vision, double vision and pain.  Respiratory: Negative for cough and shortness of breath.   Cardiovascular: Negative for chest pain and palpitations.  Gastrointestinal: Negative for abdominal pain, blood in stool, constipation, heartburn and nausea.  Genitourinary: Negative for dysuria and urgency.  Musculoskeletal: Negative for joint pain and myalgias.  Neurological: Negative for dizziness and headaches.  Endo/Heme/Allergies: Does not bruise/bleed easily.  Psychiatric/Behavioral: Negative for depression. The patient is not nervous/anxious and does not have insomnia.      Allergies  Allergen Reactions  . Penicillins     childhood    Past Medical History:  Diagnosis Date  . 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 Anxiety   . 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 Hypertriglyceridemia without hypercholesterolemia 04/29/2015    History reviewed. No  pertinent surgical history.  Social History   Socioeconomic History  . Marital status: Single    Spouse name: Not on file  . Number of children: Not on file  . Years of education: Not on file  . Highest education level: Not on file  Occupational History  . Not on file  Tobacco Use  . Smoking status: Never Smoker  . Smokeless tobacco: Never Used  Substance and Sexual Activity  . Alcohol use: Yes  . Drug use: No  . Sexual activity: Yes    Birth control/protection: None  Other Topics Concern  . Not on file  Social History Narrative  . Not on file   Social Determinants of Health   Financial Resource Strain:   . Difficulty of Paying Living Expenses:   Food Insecurity:   . Worried About Charity fundraiser in the Last Year:   . Arboriculturist in the Last Year:   Transportation Needs:   . Film/video editor (Medical):   Marland Kitchen Lack of Transportation (Non-Medical):   Physical Activity:   . Days of Exercise per Week:   . Minutes of Exercise per Session:   Stress:   . Feeling of Stress :   Social Connections:   . Frequency of Communication with Friends and Family:   . Frequency of Social Gatherings with Friends and Family:   . Attends Religious Services:   . Active Member of Clubs or Organizations:   . Attends Archivist Meetings:   Marland Kitchen Marital Status:     Family History  Problem Relation  Age of Onset  . Breast cancer Mother   . Depression Mother   . Depression Father   . Colon polyps Father   . Colon cancer Paternal Grandfather     Health Maintenance  Topic Date Due  . COVID-19 Vaccine (1) Never done  . TETANUS/TDAP  02/25/2020 (Originally 11/12/1997)  . INFLUENZA VACCINE  11/24/2019  . HIV Screening  Completed     ----------------------------------------------------------------------------------------------------------------------------------------------------------------------------------------------------------------- Physical Exam BP (!) 162/88  (BP Location: Left Arm, Patient Position: Sitting, Cuff Size: Large)   Pulse 71   Temp (!) 97.5 F (36.4 C) (Oral)   Ht 6' 5.17" (1.96 m)   Wt 299 lb 4.8 oz (135.8 kg)   SpO2 100%   BMI 35.34 kg/m   Physical Exam Constitutional:      General: He is not in acute distress. HENT:     Head: Normocephalic and atraumatic.     Right Ear: Tympanic membrane and external ear normal.     Left Ear: Tympanic membrane and external ear normal.  Eyes:     General: No scleral icterus. Neck:     Thyroid: No thyromegaly.  Cardiovascular:     Rate and Rhythm: Normal rate and regular rhythm.     Heart sounds: Normal heart sounds.  Pulmonary:     Effort: Pulmonary effort is normal.     Breath sounds: Normal breath sounds.  Abdominal:     General: Bowel sounds are normal. There is no distension.     Palpations: Abdomen is soft.     Tenderness: There is no abdominal tenderness. There is no guarding.  Musculoskeletal:     Cervical back: Normal range of motion.  Lymphadenopathy:     Cervical: No cervical adenopathy.  Skin:    General: Skin is warm and dry.     Findings: No rash.  Neurological:     General: No focal deficit present.     Mental Status: He is alert and oriented to person, place, and time.     Cranial Nerves: No cranial nerve deficit.     Motor: No abnormal muscle tone.  Psychiatric:        Mood and Affect: Mood normal.        Behavior: Behavior normal.     ------------------------------------------------------------------------------------------------------------------------------------------------------------------------------------------------------------------- Assessment and Plan  Alcohol use disorder, moderate, dependence (HCC) Counseled extensively on reduction of alcohol use. Already had signs that this is affecting liver.  Also on MTX which I discussed with him can also affect liver.  Declines detox or alcohol treatment program.   Well adult exam Orders Placed This  Encounter  Procedures  . Lipid Panel w/reflex Direct LDL  . COMPLETE METABOLIC PANEL WITH GFR  . CBC  . TSH  Screening: UTD Immunizations: UTD Anticipatory guidance/Risk factor reduction: Counseled on healthy diet and exercise.  Recommend decreased EtOH intake.    Meds ordered this encounter  Medications  . venlafaxine XR (EFFEXOR-XR) 75 MG 24 hr capsule    Sig: Take 1 capsule (75 mg total) by mouth daily with breakfast.    Dispense:  90 capsule    Refill:  1    Return in about 3 weeks (around 10/02/2019) for Nurse visit for BP check. .    This visit occurred during the SARS-CoV-2 public health emergency.  Safety protocols were in place, including screening questions prior to the visit, additional usage of staff PPE, and extensive cleaning of exam room while observing appropriate contact time as indicated for disinfecting solutions.

## 2019-09-12 LAB — COMPLETE METABOLIC PANEL WITH GFR
AG Ratio: 1.7 (calc) (ref 1.0–2.5)
ALT: 119 U/L — ABNORMAL HIGH (ref 9–46)
AST: 77 U/L — ABNORMAL HIGH (ref 10–40)
Albumin: 4.5 g/dL (ref 3.6–5.1)
Alkaline phosphatase (APISO): 81 U/L (ref 36–130)
BUN: 13 mg/dL (ref 7–25)
CO2: 24 mmol/L (ref 20–32)
Calcium: 9.8 mg/dL (ref 8.6–10.3)
Chloride: 104 mmol/L (ref 98–110)
Creat: 1.26 mg/dL (ref 0.60–1.35)
GFR, Est African American: 82 mL/min/{1.73_m2} (ref >=60)
GFR, Est Non African American: 71 mL/min/{1.73_m2} (ref >=60)
Globulin: 2.6 g/dL (calc) (ref 1.9–3.7)
Glucose, Bld: 114 mg/dL — ABNORMAL HIGH (ref 65–99)
Potassium: 4.1 mmol/L (ref 3.5–5.3)
Sodium: 137 mmol/L (ref 135–146)
Total Bilirubin: 0.7 mg/dL (ref 0.2–1.2)
Total Protein: 7.1 g/dL (ref 6.1–8.1)

## 2019-09-12 LAB — CBC
HCT: 44.3 % (ref 38.5–50.0)
Hemoglobin: 15.6 g/dL (ref 13.2–17.1)
MCH: 34.4 pg — ABNORMAL HIGH (ref 27.0–33.0)
MCHC: 35.2 g/dL (ref 32.0–36.0)
MCV: 97.6 fL (ref 80.0–100.0)
MPV: 10.4 fL (ref 7.5–12.5)
Platelets: 238 10*3/uL (ref 140–400)
RBC: 4.54 10*6/uL (ref 4.20–5.80)
RDW: 14.3 % (ref 11.0–15.0)
WBC: 3.1 10*3/uL — ABNORMAL LOW (ref 3.8–10.8)

## 2019-09-12 LAB — LIPID PANEL W/REFLEX DIRECT LDL
Cholesterol: 185 mg/dL (ref <200)
HDL: 35 mg/dL — ABNORMAL LOW (ref >=40)
LDL Cholesterol (Calc): 102 mg/dL (calc) — ABNORMAL HIGH
Non-HDL Cholesterol (Calc): 150 mg/dL (calc) — ABNORMAL HIGH (ref <130)
Total CHOL/HDL Ratio: 5.3 (calc) — ABNORMAL HIGH (ref <5.0)
Triglycerides: 361 mg/dL — ABNORMAL HIGH (ref <150)

## 2019-09-12 LAB — TSH: TSH: 1.59 mIU/L (ref 0.40–4.50)

## 2019-09-24 ENCOUNTER — Other Ambulatory Visit: Payer: Self-pay

## 2019-09-24 DIAGNOSIS — D729 Disorder of white blood cells, unspecified: Secondary | ICD-10-CM

## 2019-10-02 ENCOUNTER — Ambulatory Visit: Payer: BC Managed Care – PPO

## 2019-10-09 DIAGNOSIS — D729 Disorder of white blood cells, unspecified: Secondary | ICD-10-CM | POA: Diagnosis not present

## 2019-10-09 LAB — CBC WITH DIFFERENTIAL/PLATELET
Absolute Monocytes: 460 cells/uL (ref 200–950)
Basophils Absolute: 69 cells/uL (ref 0–200)
Basophils Relative: 1.6 %
Eosinophils Absolute: 310 cells/uL (ref 15–500)
Eosinophils Relative: 7.2 %
HCT: 46.2 % (ref 38.5–50.0)
Hemoglobin: 16.1 g/dL (ref 13.2–17.1)
Lymphs Abs: 443 cells/uL — ABNORMAL LOW (ref 850–3900)
MCH: 33.6 pg — ABNORMAL HIGH (ref 27.0–33.0)
MCHC: 34.8 g/dL (ref 32.0–36.0)
MCV: 96.5 fL (ref 80.0–100.0)
MPV: 11.2 fL (ref 7.5–12.5)
Monocytes Relative: 10.7 %
Neutro Abs: 3019 cells/uL (ref 1500–7800)
Neutrophils Relative %: 70.2 %
Platelets: 231 10*3/uL (ref 140–400)
RBC: 4.79 10*6/uL (ref 4.20–5.80)
RDW: 13.9 % (ref 11.0–15.0)
Total Lymphocyte: 10.3 %
WBC: 4.3 10*3/uL (ref 3.8–10.8)

## 2019-10-11 ENCOUNTER — Other Ambulatory Visit: Payer: Self-pay

## 2019-10-11 ENCOUNTER — Ambulatory Visit (INDEPENDENT_AMBULATORY_CARE_PROVIDER_SITE_OTHER): Payer: BC Managed Care – PPO | Admitting: Sports Medicine

## 2019-10-11 DIAGNOSIS — R030 Elevated blood-pressure reading, without diagnosis of hypertension: Secondary | ICD-10-CM | POA: Diagnosis not present

## 2019-10-11 MED ORDER — LISINOPRIL-HYDROCHLOROTHIAZIDE 10-12.5 MG PO TABS: 1.0000 | ORAL_TABLET | Freq: Every day | ORAL | 3 refills | Status: DC

## 2019-10-11 NOTE — Assessment & Plan Note (Signed)
Blood pressure continues to be elevated, on review of his chart his blood pressure was well controlled couple years ago but he has gained 75 pounds over the past 2 to 3 years. I am going to start low-dose lisinopril/HCTZ, he can follow this up in an office visit with Dr. Ashley Royalty to discuss aggressive weight loss and exercise.

## 2019-10-11 NOTE — Progress Notes (Signed)
Pt presents to clinic today for BP check. Denies any cp/sob/palpitaions/headaches/dizziness or swelling.   Pt advised to RTC in 2 wks to f/u with pcp after being on bp meds.   He voiced understanding and agreed to plan.

## 2019-10-25 ENCOUNTER — Encounter: Payer: Self-pay | Admitting: Family Medicine

## 2019-10-25 ENCOUNTER — Ambulatory Visit (INDEPENDENT_AMBULATORY_CARE_PROVIDER_SITE_OTHER): Payer: BC Managed Care – PPO | Admitting: Family Medicine

## 2019-10-25 ENCOUNTER — Other Ambulatory Visit: Payer: Self-pay

## 2019-10-25 VITALS — BP 125/71 | HR 70 | Wt 298.0 lb

## 2019-10-25 DIAGNOSIS — R030 Elevated blood-pressure reading, without diagnosis of hypertension: Secondary | ICD-10-CM

## 2019-10-25 DIAGNOSIS — I10 Essential (primary) hypertension: Secondary | ICD-10-CM | POA: Insufficient documentation

## 2019-10-25 LAB — BASIC METABOLIC PANEL
BUN: 23 mg/dL (ref 7–25)
CO2: 26 mmol/L (ref 20–32)
Calcium: 9.6 mg/dL (ref 8.6–10.3)
Chloride: 106 mmol/L (ref 98–110)
Creat: 1.24 mg/dL (ref 0.60–1.35)
Glucose, Bld: 141 mg/dL — ABNORMAL HIGH (ref 65–99)
Potassium: 4.1 mmol/L (ref 3.5–5.3)
Sodium: 136 mmol/L (ref 135–146)

## 2019-10-25 MED ORDER — LISINOPRIL-HYDROCHLOROTHIAZIDE 10-12.5 MG PO TABS: 1.0000 | ORAL_TABLET | Freq: Every day | ORAL | 2 refills | Status: DC

## 2019-10-25 NOTE — Progress Notes (Signed)
Eduardo Parker - 39 y.o. male MRN [id number]  Date of birth: November 19, 1980  Subjective Chief Complaint  Patient presents with  . Hypertension    HPI Eduardo Parker is a 39 y.o. male here today for follow up of HTN.  He was seen for nurse visit a couple of weeks ago and BP remained elevated.  Dr. Karie Schwalbe sent in lisinopril-hctz at that time.  BP has improved today.  He is tolerating medication well.  He denies symptoms of hypotension.  He has not had chest pain, shortness of breath, palpitations, headache.   ROS:  A comprehensive ROS was completed and negative except as noted per HPI  Allergies  Allergen Reactions  . Penicillins     childhood    Past Medical History:  Diagnosis Date  . 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 Anxiety   . 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 Hypertriglyceridemia without hypercholesterolemia 04/29/2015    No past surgical history on file.  Social History   Socioeconomic History  . Marital status: Single    Spouse name: Not on file  . Number of children: Not on file  . Years of education: Not on file  . Highest education level: Not on file  Occupational History  . Not on file  Tobacco Use  . Smoking status: Never Smoker  . Smokeless tobacco: Never Used  Substance and Sexual Activity  . Alcohol use: Yes  . Drug use: No  . Sexual activity: Yes    Birth control/protection: None  Other Topics Concern  . Not on file  Social History Narrative  . Not on file   Social Determinants of Health   Financial Resource Strain:   . Difficulty of Paying Living Expenses:   Food Insecurity:   . Worried About Programme researcher, broadcasting/film/video in the Last Year:   . Barista in the Last Year:   Transportation Needs:   . Freight forwarder (Medical):   Marland Kitchen Lack of Transportation (Non-Medical):   Physical Activity:   . Days of Exercise per Week:   . Minutes of Exercise per Session:   Stress:   . Feeling of Stress :   Social Connections:   . Frequency  of Communication with Friends and Family:   . Frequency of Social Gatherings with Friends and Family:   . Attends Religious Services:   . Active Member of Clubs or Organizations:   . Attends Banker Meetings:   Marland Kitchen Marital Status:     Family History  Problem Relation Age of Onset  . Breast cancer Mother   . Depression Mother   . Depression Father   . Colon polyps Father   . Colon cancer Paternal Grandfather     Health Maintenance  Topic Date Due  . TETANUS/TDAP  02/25/2020 (Originally 11/12/1997)  . COVID-19 Vaccine (1) 11/22/2020 (Originally 11/13/1990)  . INFLUENZA VACCINE  11/24/2019  . Hepatitis C Screening  Completed  . HIV Screening  Completed     ----------------------------------------------------------------------------------------------------------------------------------------------------------------------------------------------------------------- Physical Exam There were no vitals taken for this visit.  Physical Exam Constitutional:      Appearance: Normal appearance.  HENT:     Head: Normocephalic and atraumatic.  Eyes:     General: No scleral icterus. Cardiovascular:     Rate and Rhythm: Normal rate and regular rhythm.  Pulmonary:     Effort: Pulmonary effort is normal.     Breath sounds: Normal breath sounds.  Musculoskeletal:     Cervical back: Neck  supple.  Skin:    General: Skin is warm and dry.  Neurological:     General: No focal deficit present.     Mental Status: He is alert.  Psychiatric:        Mood and Affect: Mood normal.     ------------------------------------------------------------------------------------------------------------------------------------------------------------------------------------------------------------------- Assessment and Plan  Essential hypertension Blood pressure is at goal at for age and co-morbidities.  I recommend that he continue lisinopril/hctz at current dose.  Recheck BMP today.   In  addition they were instructed to follow a low sodium diet with regular exercise to help to maintain adequate control of blood pressure.     Meds ordered this encounter  Medications  . lisinopril-hydrochlorothiazide (ZESTORETIC) 10-12.5 MG tablet    Sig: Take 1 tablet by mouth daily.    Dispense:  90 tablet    Refill:  2    No follow-ups on file.    This visit occurred during the SARS-CoV-2 public health emergency.  Safety protocols were in place, including screening questions prior to the visit, additional usage of staff PPE, and extensive cleaning of exam room while observing appropriate contact time as indicated for disinfecting solutions.

## 2019-10-25 NOTE — Patient Instructions (Signed)
   Managing Your Hypertension Hypertension is commonly called high blood pressure. This is when the force of your blood pressing against the walls of your arteries is too strong. Arteries are blood vessels that carry blood from your heart throughout your body. Hypertension forces the heart to work harder to pump blood, and may cause the arteries to become narrow or stiff. Having untreated or uncontrolled hypertension can cause heart attack, stroke, kidney disease, and other problems. What are blood pressure readings? A blood pressure reading consists of a higher number over a lower number. Ideally, your blood pressure should be below 120/80. The first ("top") number is called the systolic pressure. It is a measure of the pressure in your arteries as your heart beats. The second ("bottom") number is called the diastolic pressure. It is a measure of the pressure in your arteries as the heart relaxes. What does my blood pressure reading mean? Blood pressure is classified into four stages. Based on your blood pressure reading, your health care provider may use the following stages to determine what type of treatment you need, if any. Systolic pressure and diastolic pressure are measured in a unit called mm Hg. Normal  Systolic pressure: below 120.  Diastolic pressure: below 80. Elevated  Systolic pressure: 120-129.  Diastolic pressure: below 80. Hypertension stage 1  Systolic pressure: 130-139.  Diastolic pressure: 80-89. Hypertension stage 2  Systolic pressure: 140 or above.  Diastolic pressure: 90 or above. What health risks are associated with hypertension? Managing your hypertension is an important responsibility. Uncontrolled hypertension can lead to:  A heart attack.  A stroke.  A weakened blood vessel (aneurysm).  Heart failure.  Kidney damage.  Eye damage.  Metabolic syndrome.  Memory and concentration problems. What changes can I make to manage my  hypertension? Hypertension can be managed by making lifestyle changes and possibly by taking medicines. Your health care provider will help you make a plan to bring your blood pressure within a normal range. Eating and drinking   Eat a diet that is high in fiber and potassium, and low in salt (sodium), added sugar, and fat. An example eating plan is called the DASH (Dietary Approaches to Stop Hypertension) diet. To eat this way: ? Eat plenty of fresh fruits and vegetables. Try to fill half of your plate at each meal with fruits and vegetables. ? Eat whole grains, such as whole wheat pasta, brown rice, or whole grain bread. Fill about one quarter of your plate with whole grains. ? Eat low-fat diary products. ? Avoid fatty cuts of meat, processed or cured meats, and poultry with skin. Fill about one quarter of your plate with lean proteins such as fish, chicken without skin, beans, eggs, and tofu. ? Avoid premade and processed foods. These tend to be higher in sodium, added sugar, and fat.  Reduce your daily sodium intake. Most people with hypertension should eat less than 1,500 mg of sodium a day.  Limit alcohol intake to no more than 1 drink a day for nonpregnant women and 2 drinks a day for men. One drink equals 12 oz of beer, 5 oz of wine, or 1 oz of hard liquor. Lifestyle  Work with your health care provider to maintain a healthy body weight, or to lose weight. Ask what an ideal weight is for you.  Get at least 30 minutes of exercise that causes your heart to beat faster (aerobic exercise) most days of the week. Activities may include walking, swimming, or biking.    Include exercise to strengthen your muscles (resistance exercise), such as weight lifting, as part of your weekly exercise routine. Try to do these types of exercises for 30 minutes at least 3 days a week.  Do not use any products that contain nicotine or tobacco, such as cigarettes and e-cigarettes. If you need help quitting,  ask your health care provider.  Control any long-term (chronic) conditions you have, such as high cholesterol or diabetes. Monitoring  Monitor your blood pressure at home as told by your health care provider. Your personal target blood pressure may vary depending on your medical conditions, your age, and other factors.  Have your blood pressure checked regularly, as often as told by your health care provider. Working with your health care provider  Review all the medicines you take with your health care provider because there may be side effects or interactions.  Talk with your health care provider about your diet, exercise habits, and other lifestyle factors that may be contributing to hypertension.  Visit your health care provider regularly. Your health care provider can help you create and adjust your plan for managing hypertension. Will I need medicine to control my blood pressure? Your health care provider may prescribe medicine if lifestyle changes are not enough to get your blood pressure under control, and if:  Your systolic blood pressure is 130 or higher.  Your diastolic blood pressure is 80 or higher. Take medicines only as told by your health care provider. Follow the directions carefully. Blood pressure medicines must be taken as prescribed. The medicine does not work as well when you skip doses. Skipping doses also puts you at risk for problems. Contact a health care provider if:  You think you are having a reaction to medicines you have taken.  You have repeated (recurrent) headaches.  You feel dizzy.  You have swelling in your ankles.  You have trouble with your vision. Get help right away if:  You develop a severe headache or confusion.  You have unusual weakness or numbness, or you feel faint.  You have severe pain in your chest or abdomen.  You vomit repeatedly.  You have trouble breathing. Summary  Hypertension is when the force of blood pumping  through your arteries is too strong. If this condition is not controlled, it may put you at risk for serious complications.  Your personal target blood pressure may vary depending on your medical conditions, your age, and other factors. For most people, a normal blood pressure is less than 120/80.  Hypertension is managed by lifestyle changes, medicines, or both. Lifestyle changes include weight loss, eating a healthy, low-sodium diet, exercising more, and limiting alcohol. This information is not intended to replace advice given to you by your health care provider. Make sure you discuss any questions you have with your health care provider. Document Revised: 08/03/2018 Document Reviewed: 03/09/2016 Elsevier Patient Education  2020 Else[employer] Inc[employer]  

## 2019-10-25 NOTE — Assessment & Plan Note (Signed)
Blood pressure is at goal at for age and co-morbidities.  I recommend that he continue lisinopril/hctz at current dose.  Recheck BMP today.   In addition they were instructed to follow a low sodium diet with regular exercise to help to maintain adequate control of blood pressure.

## 2020-03-22 ENCOUNTER — Other Ambulatory Visit: Payer: Self-pay | Admitting: Family Medicine

## 2020-03-22 DIAGNOSIS — F411 Generalized anxiety disorder: Secondary | ICD-10-CM

## 2020-04-21 ENCOUNTER — Other Ambulatory Visit: Payer: Self-pay

## 2020-04-21 ENCOUNTER — Ambulatory Visit (INDEPENDENT_AMBULATORY_CARE_PROVIDER_SITE_OTHER): Payer: BC Managed Care – PPO | Admitting: Physician Assistant

## 2020-04-21 ENCOUNTER — Encounter: Payer: Self-pay | Admitting: Physician Assistant

## 2020-04-21 VITALS — BP 128/85 | HR 82 | Ht 76.0 in | Wt 278.0 lb

## 2020-04-21 DIAGNOSIS — I10 Essential (primary) hypertension: Secondary | ICD-10-CM

## 2020-04-21 DIAGNOSIS — R631 Polydipsia: Secondary | ICD-10-CM

## 2020-04-21 DIAGNOSIS — R42 Dizziness and giddiness: Secondary | ICD-10-CM

## 2020-04-21 DIAGNOSIS — R682 Dry mouth, unspecified: Secondary | ICD-10-CM

## 2020-04-21 DIAGNOSIS — F411 Generalized anxiety disorder: Secondary | ICD-10-CM

## 2020-04-21 DIAGNOSIS — D861 Sarcoidosis of lymph nodes: Secondary | ICD-10-CM

## 2020-04-21 NOTE — Progress Notes (Signed)
Subjective:    Patient ID: Eduardo Parker, male    DOB: 1980-05-28, 39 y.o.   MRN: [id number]  HPI  Pt is a 39 yo male with HTN, GAD who presents to the clinic with a couple of days, started around christmas day,  of dizziness, dry mouth, excessive thirst, blurry vision. He denies any fever, chills, GI symptoms, urinary symptoms, bowel changes, URI symptoms. No medication changes recently. Stopped BP medication because he cut back on drinking alcohol. Down to 6-7 beers 3-4 times a week. Not checking BP at home. He is taking effexor.   Pt does have sarcoidosis and known retinal edema.   .. Active Ambulatory Problems    Diagnosis Date Noted  . Generalized anxiety disorder 03/24/2015  . Left knee pain 03/24/2015  . Right groin mass 04/28/2015  . Hypertriglyceridemia without hypercholesterolemia 04/29/2015  . Low libido 08/22/2016  . Elevated ALT measurement 08/23/2016  . 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 Thiamine deficiency 05/22/2017  . Deviated nasal septum 05/30/2017  . Environmental allergies 05/30/2017  . Dysthymia 05/30/2017  . Elevated blood pressure reading 05/30/2017  . Gastroesophageal reflux disease with esophagitis 07/12/2018  . Mass of left side of neck 07/22/2018  . Cervical lymphadenopathy 07/22/2018  . Acute renal failure (HCC) 11/09/2018  . Hypercalcemia 11/09/2018  . Constipation 11/11/2018  . Family history of colon cancer 11/11/2018  . 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 Splenomegaly 11/09/2018  . Retinal edema 06/27/2019  . Alcohol use disorder, moderate, dependence (HCC) 09/11/2019  . Essential hypertension 10/25/2019   Resolved Ambulatory Problems    Diagnosis Date Noted  . Rash and nonspecific skin eruption 05/17/2017  . Heavy alcohol consumption 05/17/2017  . Well adult exam 09/11/2019   Past Medical History:  Diagnosis Date  . Anxiety          Review of Systems See HPI.      Objective:   Physical Exam Vitals reviewed.  Constitutional:      Appearance: Normal appearance. He is obese.  HENT:     Head: Normocephalic.  Cardiovascular:     Rate and Rhythm: Normal rate and regular rhythm.     Pulses: Normal pulses.     Heart sounds: No murmur heard.   Pulmonary:     Effort: Pulmonary effort is normal.     Breath sounds: Normal breath sounds.  Musculoskeletal:     Right lower leg: No edema.     Left lower leg: No edema.  Neurological:     General: No focal deficit present.     Mental Status: He is alert and oriented to person, place, and time.     Cranial Nerves: No cranial nerve deficit.     Motor: No weakness.     Coordination: Coordination normal.     Gait: Gait normal.     Deep Tendon Reflexes: Reflexes normal.     Comments: Negative dix hallpike  Psychiatric:        Mood and Affect: Mood normal.           Assessment & Plan:  .Marland KitchenHuel was seen today for dry mouth.  Diagnoses and all orders for this visit:  Dizziness -     COMPLETE METABOLIC PANEL WITH GFR -     CBC with Differential/Platelet -     Hemoglobin A1c -     TSH  Dry mouth -     COMPLETE METABOLIC PANEL WITH GFR -  CBC with Differential/Platelet -     Hemoglobin A1c -     TSH  Excessive thirst -     COMPLETE METABOLIC PANEL WITH GFR -     CBC with Differential/Platelet -     Hemoglobin A1c -     TSH  Essential hypertension  Sarcoidosis of lymph nodes  Generalized anxiety disorder   Unclear etiology of non specific symptoms. Will evaluate liver, kidney, glucose.  No signs of vertigo.  No signs of bacterial infection.  Could be viral infection.  Does have sarcoidosis which could make more susceptible to other autoimmune disease.  Get labs, keep hydrated, get rested.  Follow up with eye doctor.  If continues PCP to further look into more auto-immune disease.   BP great today.  Continue to cut back on alcohol. Goal no more than 4 beers at a time no more  than twice a week.

## 2020-04-22 ENCOUNTER — Encounter: Payer: Self-pay | Admitting: Physician Assistant

## 2020-04-22 ENCOUNTER — Telehealth (INDEPENDENT_AMBULATORY_CARE_PROVIDER_SITE_OTHER): Payer: BC Managed Care – PPO | Admitting: Physician Assistant

## 2020-04-22 DIAGNOSIS — E1165 Type 2 diabetes mellitus with hyperglycemia: Secondary | ICD-10-CM

## 2020-04-22 HISTORY — DX: Type 2 diabetes mellitus with hyperglycemia: E11.65

## 2020-04-22 LAB — HEMOGLOBIN A1C
Hgb A1c MFr Bld: 10.1 % of total Hgb — ABNORMAL HIGH (ref <5.7)
Mean Plasma Glucose: 243 mg/dL
eAG (mmol/L): 13.5 mmol/L

## 2020-04-22 LAB — COMPLETE METABOLIC PANEL WITH GFR
AG Ratio: 1.8 (calc) (ref 1.0–2.5)
ALT: 57 U/L — ABNORMAL HIGH (ref 9–46)
AST: 35 U/L (ref 10–40)
Albumin: 4.7 g/dL (ref 3.6–5.1)
Alkaline phosphatase (APISO): 104 U/L (ref 36–130)
BUN: 17 mg/dL (ref 7–25)
CO2: 23 mmol/L (ref 20–32)
Calcium: 9.9 mg/dL (ref 8.6–10.3)
Chloride: 96 mmol/L — ABNORMAL LOW (ref 98–110)
Creat: 1.33 mg/dL (ref 0.60–1.35)
GFR, Est African American: 76 mL/min/{1.73_m2} (ref >=60)
GFR, Est Non African American: 66 mL/min/{1.73_m2} (ref >=60)
Globulin: 2.6 g/dL (calc) (ref 1.9–3.7)
Glucose, Bld: 333 mg/dL — ABNORMAL HIGH (ref 65–99)
Potassium: 4.1 mmol/L (ref 3.5–5.3)
Sodium: 132 mmol/L — ABNORMAL LOW (ref 135–146)
Total Bilirubin: 1 mg/dL (ref 0.2–1.2)
Total Protein: 7.3 g/dL (ref 6.1–8.1)

## 2020-04-22 LAB — CBC WITH DIFFERENTIAL/PLATELET
Absolute Monocytes: 319 cells/uL (ref 200–950)
Basophils Absolute: 59 cells/uL (ref 0–200)
Basophils Relative: 1.4 %
Eosinophils Absolute: 189 cells/uL (ref 15–500)
Eosinophils Relative: 4.5 %
HCT: 46.9 % (ref 38.5–50.0)
Hemoglobin: 16.6 g/dL (ref 13.2–17.1)
Lymphs Abs: 458 cells/uL — ABNORMAL LOW (ref 850–3900)
MCH: 32.9 pg (ref 27.0–33.0)
MCHC: 35.4 g/dL (ref 32.0–36.0)
MCV: 93.1 fL (ref 80.0–100.0)
MPV: 10.9 fL (ref 7.5–12.5)
Monocytes Relative: 7.6 %
Neutro Abs: 3175 cells/uL (ref 1500–7800)
Neutrophils Relative %: 75.6 %
Platelets: 226 10*3/uL (ref 140–400)
RBC: 5.04 10*6/uL (ref 4.20–5.80)
RDW: 12.1 % (ref 11.0–15.0)
Total Lymphocyte: 10.9 %
WBC: 4.2 10*3/uL (ref 3.8–10.8)

## 2020-04-22 LAB — TSH: TSH: 2.1 mIU/L (ref 0.40–4.50)

## 2020-04-22 NOTE — Progress Notes (Signed)
Diaz,   Your sugars are very high. Your 3 month average is over 10. You are consider diabetic if sugars are over 6.5. your sodium is also a little low.  Your thyroid is good.   Have you had any recent prednisone?   With sugars that high I would expect you not to feel well and have many of the symptoms you described. We must start medications. We do need to be a little aggressive to get these sugars down and I know medication for this disease can be intimidating. There are also other recommendations that come with this diagnosis like lower goals for cholesterol. Dr. Ashley Royalty will not be in for another week but I do think you need to start something now. Could we schedule a quick phone call today to discuss options?

## 2020-04-22 NOTE — Progress Notes (Signed)
..  Virtual Visit via Telephone Note  I connected with Eduardo Parker on 04/22/20 at  3:20 PM EST by telephone and verified that I am speaking with the correct person using two identifiers.  I attempted to call patient twice. I left VM. Will need to reschedule.   His A1C is 10.1 and we need to discuss treatment plan as well as increased risk and new BP/LDL goals.     Tandy Gaw, PA-C

## 2020-04-27 ENCOUNTER — Ambulatory Visit: Payer: BC Managed Care – PPO | Admitting: Family Medicine

## 2020-04-30 ENCOUNTER — Ambulatory Visit: Payer: BC Managed Care – PPO | Admitting: Family Medicine

## 2020-05-04 ENCOUNTER — Encounter: Payer: Self-pay | Admitting: Family Medicine

## 2020-05-04 ENCOUNTER — Other Ambulatory Visit: Payer: Self-pay

## 2020-05-04 ENCOUNTER — Ambulatory Visit (INDEPENDENT_AMBULATORY_CARE_PROVIDER_SITE_OTHER): Payer: BC Managed Care – PPO | Admitting: Family Medicine

## 2020-05-04 VITALS — BP 137/68 | HR 72 | Temp 97.5°F | Wt 280.6 lb

## 2020-05-04 DIAGNOSIS — Z23 Encounter for immunization: Secondary | ICD-10-CM | POA: Diagnosis not present

## 2020-05-04 DIAGNOSIS — E1165 Type 2 diabetes mellitus with hyperglycemia: Secondary | ICD-10-CM

## 2020-05-04 MED ORDER — OZEMPIC (0.25 OR 0.5 MG/DOSE) 2 MG/1.5ML ~~LOC~~ SOPN: PEN_INJECTOR | SUBCUTANEOUS | 1 refills | Status: DC

## 2020-05-04 MED ORDER — BLOOD GLUCOSE METER KIT: PACK | 0 refills | Status: DC

## 2020-05-04 MED ORDER — METFORMIN HCL ER 500 MG PO TB24: 1000.0000 mg | ORAL_TABLET | Freq: Every day | ORAL | 1 refills | Status: DC

## 2020-05-04 NOTE — Progress Notes (Signed)
Eduardo Parker - 40 y.o. male MRN [id number]  Date of birth: [birth year]  Subjective Chief Complaint  Patient presents with  . Diabetes    HPI Eduardo Parker is a 40 year old male here today to discuss recent diagnosis of diabetes.  He was seen at the end of December with complaint of dizziness, dry mouth and blurry vision.  Glucose returned elevated with A1c of 10.  He has been hydrating and working on dietary changes since this visit.  He is aware that he will need medication to help with management of his blood sugars as well.  His symptoms have improved since visit in December.  He does plan to schedule an appointment for annual eye exam.    ROS:  A comprehensive ROS was completed and negative except as noted per HPI  Allergies  Allergen Reactions  . Penicillins     childhood    Past Medical History:  Diagnosis Date  . 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 Anxiety   . 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 Hypertriglyceridemia without hypercholesterolemia 04/29/2015    History reviewed. No pertinent surgical history.  Social History   Socioeconomic History  . Marital status: Single    Spouse name: Not on file  . Number of children: Not on file  . Years of education: Not on file  . Highest education level: Not on file  Occupational History  . Not on file  Tobacco Use  . Smoking status: Never Smoker  . Smokeless tobacco: Never Used  Substance and Sexual Activity  . Alcohol use: Yes  . Drug use: No  . Sexual activity: Yes    Birth control/protection: None  Other Topics Concern  . Not on file  Social History Narrative  . Not on file   Social Determinants of Health   Financial Resource Strain: Not on file  Food Insecurity: Not on file  Transportation Needs: Not on file  Physical Activity: Not on file  Stress: Not on file  Social Connections: Not on file    Family History  Problem Relation Age of Onset  . Breast cancer Mother   . Depression Mother    . Depression Father   . Colon polyps Father   . Colon cancer Paternal Grandfather     Health Maintenance  Topic Date Due  . FOOT EXAM  Never done  . OPHTHALMOLOGY EXAM  Never done  . URINE MICROALBUMIN  Never done  . TETANUS/TDAP  Never done  . INFLUENZA VACCINE  07/23/2020 (Originally 11/24/2019)  . COVID-19 Vaccine (1) 11/22/2020 (Originally 11/13/1990)  . HEMOGLOBIN A1C  10/20/2020  . PNEUMOCOCCAL POLYSACCHARIDE VACCINE AGE 9-64 HIGH RISK  Completed  . Hepatitis C Screening  Completed  . HIV Screening  Completed     ----------------------------------------------------------------------------------------------------------------------------------------------------------------------------------------------------------------- Physical Exam BP 137/68 (BP Location: Left Arm, Patient Position: Sitting, Cuff Size: Large)   Pulse 72   Temp (!) 97.5 F (36.4 C)   Wt 280 lb 9.6 oz (127.3 kg)   SpO2 99%   BMI 34.16 kg/m   Physical Exam Constitutional:      Appearance: Normal appearance.  Eyes:     General: No scleral icterus. Cardiovascular:     Rate and Rhythm: Normal rate and regular rhythm.  Pulmonary:     Effort: Pulmonary effort is normal.     Breath sounds: Normal breath sounds.  Musculoskeletal:     Cervical back: Neck supple.  Neurological:     General: No focal deficit present.     Mental  Status: He is alert.  Psychiatric:        Mood and Affect: Mood normal.        Behavior: Behavior normal.     ------------------------------------------------------------------------------------------------------------------------------------------------------------------------------------------------------------------- Assessment and Plan  Type 2 diabetes mellitus with hyperglycemia, without long-term current use of insulin (HCC) New diagnosis of diabetes based on recent lab work with A1c of 10.  He has been symptomatic and we discussed either starting insulin or combo therapy  with current symptoms and A1c.  He would prefer to try combo therapy initially and will start metformin XR 1000 mg each morning as well as Ozempic.  Side effects related to each of these were reviewed with him.  We also reviewed dietary changes including following a reduced carbohydrate diet and limiting alcohol intake.  Exercise goals reviewed.  Discussed standards of care related to diabetes including recommendations for annual foot and eye exam.  Recommendations for cholesterol control and management, vaccinations including Pneumovax, flu and COVID vaccines.  Pneumovax 23 vaccine given today.  He declined flu vaccine.   Meds ordered this encounter  Medications  . Semaglutide,0.25 or 0.5MG /DOS, (OZEMPIC, 0.25 OR 0.5 MG/DOSE,) 2 MG/1.5ML SOPN    Sig: Inject 0.25mg  weekly x4 weeks then increase to 0.5mg  weekly    Dispense:  3 mL    Refill:  1  . metFORMIN (GLUCOPHAGE XR) 500 MG 24 hr tablet    Sig: Take 2 tablets (1,000 mg total) by mouth daily with breakfast.    Dispense:  180 tablet    Refill:  1  . blood glucose meter kit and supplies    Sig: Dispense based on patient and/or insurance preference.  Please dispense strips and lancets for meter dispensed.  Use to check glucose daily.    Dispense:  1 each    Refill:  0    Order Specific Question:   Number of strips    Answer:   100    Order Specific Question:   Number of lancets    Answer:   100    Return in about 6 weeks (around 06/15/2020) for DM.    This visit occurred during the SARS-CoV-2 public health emergency.  Safety protocols were in place, including screening questions prior to the visit, additional usage of staff PPE, and extensive cleaning of exam room while observing appropriate contact time as indicated for disinfecting solutions.

## 2020-05-04 NOTE — Patient Instructions (Signed)
Reschedule eye exam and have report sent over to our clinic.    Diabetes Mellitus and Nutrition, Adult When you have diabetes, or diabetes mellitus, it is very important to have healthy eating habits because your blood sugar (glucose) levels are greatly affected by what you eat and drink. Eating healthy foods in the right amounts, at about the same times every day, can help you:  Control your blood glucose.  Lower your risk of heart disease.  Improve your blood pressure.  Reach or maintain a healthy weight. What can affect my meal plan? Every person with diabetes is different, and each person has different needs for a meal plan. Your health care provider may recommend that you work with a dietitian to make a meal plan that is best for you. Your meal plan may vary depending on factors such as:  The calories you need.  The medicines you take.  Your weight.  Your blood glucose, blood pressure, and cholesterol levels.  Your activity level.  Other health conditions you have, such as heart or kidney disease. How do carbohydrates affect me? Carbohydrates, also called carbs, affect your blood glucose level more than any other type of food. Eating carbs naturally raises the amount of glucose in your blood. Carb counting is a method for keeping track of how many carbs you eat. Counting carbs is important to keep your blood glucose at a healthy level, especially if you use insulin or take certain oral diabetes medicines. It is important to know how many carbs you can safely have in each meal. This is different for every person. Your dietitian can help you calculate how many carbs you should have at each meal and for each snack. How does alcohol affect me? Alcohol can cause a sudden decrease in blood glucose (hypoglycemia), especially if you use insulin or take certain oral diabetes medicines. Hypoglycemia can be a life-threatening condition. Symptoms of hypoglycemia, such as sleepiness,  dizziness, and confusion, are similar to symptoms of having too much alcohol.  Do not drink alcohol if: ? Your health care provider tells you not to drink. ? You are pregnant, may be pregnant, or are planning to become pregnant.  If you drink alcohol: ? Do not drink on an empty stomach. ? Limit how much you use to:  0-1 drink a day for women.  0-2 drinks a day for men. ? Be aware of how much alcohol is in your drink. In the [place] one drink equals one 12 oz bottle of beer (355 mL), one 5 oz glass of wine (148 mL), or one 1 oz glass of hard liquor (44 mL). ? Keep yourself hydrated with water, diet soda, or unsweetened iced tea.  Keep in mind that regular soda, juice, and other mixers may contain a lot of sugar and must be counted as carbs. What are tips for following this plan? Reading food labels  Start by checking the serving size on the "Nutrition Facts" label of packaged foods and drinks. The amount of calories, carbs, fats, and other nutrients listed on the label is based on one serving of the item. Many items contain more than one serving per package.  Check the total grams (g) of carbs in one serving. You can calculate the number of servings of carbs in one serving by dividing the total carbs by 15. For example, if a food has 30 g of total carbs per serving, it would be equal to 2 servings of carbs.  Check the number of grams (  g) of saturated fats and trans fats in one serving. Choose foods that have a low amount or none of these fats.  Check the number of milligrams (mg) of salt (sodium) in one serving. Most people should limit total sodium intake to less than 2,300 mg per day.  Always check the nutrition information of foods labeled as "low-fat" or "nonfat." These foods may be higher in added sugar or refined carbs and should be avoided.  Talk to your dietitian to identify your daily goals for nutrients listed on the label. Shopping  Avoid buying canned, pre-made, or  processed foods. These foods tend to be high in fat, sodium, and added sugar.  Shop around the outside edge of the grocery store. This is where you will most often find fresh fruits and vegetables, bulk grains, fresh meats, and fresh dairy. Cooking  Use low-heat cooking methods, such as baking, instead of high-heat cooking methods like deep frying.  Cook using healthy oils, such as olive, canola, or sunflower oil.  Avoid cooking with butter, cream, or high-fat meats. Meal planning  Eat meals and snacks regularly, preferably at the same times every day. Avoid going long periods of time without eating.  Eat foods that are high in fiber, such as fresh fruits, vegetables, beans, and whole grains. Talk with your dietitian about how many servings of carbs you can eat at each meal.  Eat 4-6 oz (112-168 g) of lean protein each day, such as lean meat, chicken, fish, eggs, or tofu. One ounce (oz) of lean protein is equal to: ? 1 oz (28 g) of meat, chicken, or fish. ? 1 egg. ?  cup (62 g) of tofu.  Eat some foods each day that contain healthy fats, such as avocado, nuts, seeds, and fish.   What foods should I eat? Fruits Berries. Apples. Oranges. Peaches. Apricots. Plums. Grapes. Mango. Papaya. Pomegranate. Kiwi. Cherries. Vegetables Lettuce. Spinach. Leafy greens, including kale, chard, collard greens, and mustard greens. Beets. Cauliflower. Cabbage. Broccoli. Carrots. Green beans. Tomatoes. Peppers. Onions. Cucumbers. Brussels sprouts. Grains Whole grains, such as whole-wheat or whole-grain bread, crackers, tortillas, cereal, and pasta. Unsweetened oatmeal. Quinoa. Brown or wild rice. Meats and other proteins Seafood. Poultry without skin. Lean cuts of poultry and beef. Tofu. Nuts. Seeds. Dairy Low-fat or fat-free dairy products such as milk, yogurt, and cheese. The items listed above may not be a complete list of foods and beverages you can eat. Contact a dietitian for more  information. What foods should I avoid? Fruits Fruits canned with syrup. Vegetables Canned vegetables. Frozen vegetables with butter or cream sauce. Grains Refined white flour and flour products such as bread, pasta, snack foods, and cereals. Avoid all processed foods. Meats and other proteins Fatty cuts of meat. Poultry with skin. Breaded or fried meats. Processed meat. Avoid saturated fats. Dairy Full-fat yogurt, cheese, or milk. Beverages Sweetened drinks, such as soda or iced tea. The items listed above may not be a complete list of foods and beverages you should avoid. Contact a dietitian for more information. Questions to ask a health care provider  Do I need to meet with a diabetes educator?  Do I need to meet with a dietitian?  What number can I call if I have questions?  When are the best times to check my blood glucose? Where to find more information:  American Diabetes Association: diabetes.org  Academy of Nutrition and Dietetics: www.eatright.[employer] of Diabetes and Digestive and Kidney Diseases: CarFlippers.tn  Association of Diabetes  Care and Education Specialists: www.diabeteseducator.org Summary  It is important to have healthy eating habits because your blood sugar (glucose) levels are greatly affected by what you eat and drink.  A healthy meal plan will help you control your blood glucose and maintain a healthy lifestyle.  Your health care provider may recommend that you work with a dietitian to make a meal plan that is best for you.  Keep in mind that carbohydrates (carbs) and alcohol have immediate effects on your blood glucose levels. It is important to count carbs and to use alcohol carefully. This information is not intended to replace advice given to you by your health care provider. Make sure you discuss any questions you have with your health care provider. Document Revised: 03/19/2019 Document Reviewed: 03/19/2019 Elsevier  Patient Education  2021 [employer].

## 2020-05-04 NOTE — Assessment & Plan Note (Addendum)
New diagnosis of diabetes based on recent lab work with A1c of 10.  He has been symptomatic and we discussed either starting insulin or combo therapy with current symptoms and A1c.  He would prefer to try combo therapy initially and will start metformin XR 1000 mg each morning as well as Ozempic.  Side effects related to each of these were reviewed with him.  We also reviewed dietary changes including following a reduced carbohydrate diet and limiting alcohol intake.  Exercise goals reviewed.  Discussed standards of care related to diabetes including recommendations for annual foot and eye exam.  Recommendations for cholesterol control and management, vaccinations including Pneumovax, flu and COVID vaccines.  Pneumovax 23 vaccine given today.  He declined flu vaccine.

## 2020-05-07 ENCOUNTER — Telehealth: Payer: Self-pay

## 2020-05-07 NOTE — Telephone Encounter (Signed)
Spoke with patient about getting glucose readings in range so his symptoms can possibly subside. He is currently having some tingling in his left leg and some blurry vision. He is scheduled for an eye exam on 05/27/2020. Patient reports that he is walking on his lunch hour and using an elliptical after work to help with the leg. He reported that he feels much better and less anxious. He will call back if he has any additional questions/concerns.

## 2020-05-25 ENCOUNTER — Other Ambulatory Visit: Payer: Self-pay | Admitting: Family Medicine

## 2020-05-27 DIAGNOSIS — E119 Type 2 diabetes mellitus without complications: Secondary | ICD-10-CM | POA: Diagnosis not present

## 2020-05-27 LAB — HM DIABETES EYE EXAM

## 2020-06-15 ENCOUNTER — Encounter: Payer: Self-pay | Admitting: Family Medicine

## 2020-06-15 ENCOUNTER — Ambulatory Visit: Payer: BC Managed Care – PPO | Admitting: Family Medicine

## 2020-06-15 ENCOUNTER — Other Ambulatory Visit: Payer: Self-pay

## 2020-06-15 DIAGNOSIS — E1165 Type 2 diabetes mellitus with hyperglycemia: Secondary | ICD-10-CM | POA: Diagnosis not present

## 2020-06-15 DIAGNOSIS — I10 Essential (primary) hypertension: Secondary | ICD-10-CM

## 2020-06-15 DIAGNOSIS — F341 Dysthymic disorder: Secondary | ICD-10-CM | POA: Diagnosis not present

## 2020-06-15 DIAGNOSIS — F411 Generalized anxiety disorder: Secondary | ICD-10-CM | POA: Diagnosis not present

## 2020-06-15 MED ORDER — VENLAFAXINE HCL ER 75 MG PO CP24: 75.0000 mg | ORAL_CAPSULE | Freq: Every day | ORAL | 1 refills | Status: DC

## 2020-06-15 NOTE — Patient Instructions (Signed)
Great progress! Continue current medications.  See me again in 6-8 weeks.

## 2020-06-15 NOTE — Assessment & Plan Note (Signed)
He continues to do well with effexor.  Rx refilled.

## 2020-06-15 NOTE — Progress Notes (Signed)
Eduardo Parker - 40 y.o. male MRN [id number]  Date of birth: May 05, 1980  Subjective Chief Complaint  Patient presents with  . Follow-up    HPI Eduardo Parker is a 40 y.o. male here today for follow up T2DM, HTN and depression.   Newly diagnosed diabetes at last visit.  Started on metformin and Ozempic.  He is tolerating medication well so far.  He has only taken 2 doses of ozempic due medication being backordered.  In addition to medication he has made changes to his diet and activity level.  Blood sugars around 90-120.    He is not currently on medication for treatment of HTN and BP remains well controlled with lifestyle change and reduction in EtOH consumption.   Needs refill of effexor for dysthymia/depression.  This continues to work well for him.    ROS:  A comprehensive ROS was completed and negative except as noted per HPI  Allergies  Allergen Reactions  . Penicillins     childhood    Past Medical History:  Diagnosis Date  . 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 Anxiety   . 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 Hypertriglyceridemia without hypercholesterolemia 04/29/2015    History reviewed. No pertinent surgical history.  Social History   Socioeconomic History  . Marital status: Single    Spouse name: Not on file  . Number of children: Not on file  . Years of education: Not on file  . Highest education level: Not on file  Occupational History  . Not on file  Tobacco Use  . Smoking status: Never Smoker  . Smokeless tobacco: Never Used  Substance and Sexual Activity  . Alcohol use: Yes  . Drug use: No  . Sexual activity: Yes    Birth control/protection: None  Other Topics Concern  . Not on file  Social History Narrative  . Not on file   Social Determinants of Health   Financial Resource Strain: Not on file  Food Insecurity: Not on file  Transportation Needs: Not on file  Physical Activity: Not on file  Stress: Not on file  Social  Connections: Not on file    Family History  Problem Relation Age of Onset  . Breast cancer Mother   . Depression Mother   . Depression Father   . Colon polyps Father   . Colon cancer Paternal Grandfather     Health Maintenance  Topic Date Due  . FOOT EXAM  Never done  . OPHTHALMOLOGY EXAM  Never done  . URINE MICROALBUMIN  Never done  . TETANUS/TDAP  Never done  . INFLUENZA VACCINE  07/23/2020 (Originally 11/24/2019)  . COVID-19 Vaccine (1) 11/22/2020 (Originally 11/13/1990)  . HEMOGLOBIN A1C  10/20/2020  . PNEUMOCOCCAL POLYSACCHARIDE VACCINE AGE 32-64 HIGH RISK  Completed  . Hepatitis C Screening  Completed  . HIV Screening  Completed     ----------------------------------------------------------------------------------------------------------------------------------------------------------------------------------------------------------------- Physical Exam BP 132/65 (BP Location: Left Arm, Patient Position: Sitting, Cuff Size: Large)   Pulse 71   Temp 97.7 F (36.5 C)   Wt 273 lb 3.2 oz (123.9 kg)   SpO2 100%   BMI 33.25 kg/m   Physical Exam Constitutional:      Appearance: Normal appearance.  HENT:     Head: Normocephalic.  Eyes:     General: No scleral icterus. Cardiovascular:     Rate and Rhythm: Normal rate and regular rhythm.  Pulmonary:     Effort: Pulmonary effort is normal.     Breath sounds: Normal  breath sounds.  Musculoskeletal:     Cervical back: Normal range of motion and neck supple.  Neurological:     General: No focal deficit present.     Mental Status: He is alert.  Psychiatric:        Mood and Affect: Mood normal.        Behavior: Behavior normal.     ------------------------------------------------------------------------------------------------------------------------------------------------------------------------------------------------------------------- Assessment and Plan  Essential hypertension BP remains well controlled today.   Recommend that he continue current lifestyle change and reduction in EtOH consumption.    Type 2 diabetes mellitus with hyperglycemia, without long-term current use of insulin (HCC) Blood sugar readings at home have been pretty good.  Encouraged to continue diet and lifestyle change.  Continue current medications Recheck a1c in 6-8 weeks.   Dysthymia He continues to do well with effexor.  Rx refilled.    Meds ordered this encounter  Medications  . venlafaxine XR (EFFEXOR-XR) 75 MG 24 hr capsule    Sig: Take 1 capsule (75 mg total) by mouth daily with breakfast.    Dispense:  90 capsule    Refill:  1    Return in about 8 weeks (around 08/10/2020) for T2DM/HTN.    This visit occurred during the SARS-CoV-2 public health emergency.  Safety protocols were in place, including screening questions prior to the visit, additional usage of staff PPE, and extensive cleaning of exam room while observing appropriate contact time as indicated for disinfecting solutions.

## 2020-06-15 NOTE — Assessment & Plan Note (Signed)
BP remains well controlled today.  Recommend that he continue current lifestyle change and reduction in EtOH consumption.

## 2020-06-15 NOTE — Assessment & Plan Note (Signed)
Blood sugar readings at home have been pretty good.  Encouraged to continue diet and lifestyle change.  Continue current medications Recheck a1c in 6-8 weeks.

## 2020-08-10 ENCOUNTER — Encounter: Payer: Self-pay | Admitting: Family Medicine

## 2020-08-10 ENCOUNTER — Other Ambulatory Visit: Payer: Self-pay

## 2020-08-10 ENCOUNTER — Ambulatory Visit (INDEPENDENT_AMBULATORY_CARE_PROVIDER_SITE_OTHER): Payer: BC Managed Care – PPO | Admitting: Family Medicine

## 2020-08-10 VITALS — BP 136/84 | HR 67 | Temp 97.1°F | Ht 77.0 in | Wt 263.8 lb

## 2020-08-10 DIAGNOSIS — E1165 Type 2 diabetes mellitus with hyperglycemia: Secondary | ICD-10-CM

## 2020-08-10 DIAGNOSIS — I10 Essential (primary) hypertension: Secondary | ICD-10-CM

## 2020-08-10 DIAGNOSIS — D863 Sarcoidosis of skin: Secondary | ICD-10-CM | POA: Diagnosis not present

## 2020-08-10 LAB — POCT GLYCOSYLATED HEMOGLOBIN (HGB A1C): Hemoglobin A1C: 4.6 % (ref 4.0–5.6)

## 2020-08-10 MED ORDER — TRIAMCINOLONE ACETONIDE 0.1 % EX CREA: 1.0000 "application " | TOPICAL_CREAM | Freq: Two times a day (BID) | CUTANEOUS | 0 refills | Status: DC

## 2020-08-10 MED ORDER — METFORMIN HCL ER 500 MG PO TB24: 1000.0000 mg | ORAL_TABLET | Freq: Every day | ORAL | 1 refills | Status: DC

## 2020-08-10 NOTE — Assessment & Plan Note (Signed)
Blood pressure is at goal at for age and co-morbidities.  He is doing well with lifestyle and dietary changes to improve this.  He will continue to monitor at home as well.

## 2020-08-10 NOTE — Patient Instructions (Signed)
Great to see you today! You can stop the Ozempic for now.  Continue to work on dietary and lifestyle change.  We'll continue metformin for now but may be able to stop this at your next visit if A1c remains well controlled.  See me again in 3 months.

## 2020-08-10 NOTE — Progress Notes (Signed)
Eduardo Parker - 40 y.o. male MRN [id number]  Date of birth: 11/11/1980  Subjective Chief Complaint  Patient presents with  . Diabetes  . Hypertension    HPI Eduardo Parker is a 40 y.o. male here today for follow up of diabetes and HTN   He was started on metformin and ozempic at last appt.  Blood sugars have been around 85-100.  He denies symptoms of hypoglycemia.  In addition to medication he has made significant changes to his diet.  He has cut back on EtOH consumption.  Weight is down an additional 10 lbs today.      He has cutaneous manifestations related to sarcoid on his back.  He did take systemic steroid previously but this caused weight gain.  He does not recall if he has used topical medications before.   Allergies  Allergen Reactions  . Penicillins     childhood    Past Medical History:  Diagnosis Date  . 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 Anxiety   . 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 Hypertriglyceridemia without hypercholesterolemia 04/29/2015    History reviewed. No pertinent surgical history.  Social History   Socioeconomic History  . Marital status: Single    Spouse name: Not on file  . Number of children: Not on file  . Years of education: Not on file  . Highest education level: Not on file  Occupational History  . Not on file  Tobacco Use  . Smoking status: Never Smoker  . Smokeless tobacco: Never Used  Substance and Sexual Activity  . Alcohol use: Yes  . Drug use: No  . Sexual activity: Yes    Birth control/protection: None  Other Topics Concern  . Not on file  Social History Narrative  . Not on file   Social Determinants of Health   Financial Resource Strain: Not on file  Food Insecurity: Not on file  Transportation Needs: Not on file  Physical Activity: Not on file  Stress: Not on file  Social Connections: Not on file    Family History  Problem Relation Age of Onset  . Breast cancer Mother   . Depression Mother    . Depression Father   . Colon polyps Father   . Colon cancer Paternal Grandfather     Health Maintenance  Topic Date Due  . FOOT EXAM  Never done  . OPHTHALMOLOGY EXAM  Never done  . URINE MICROALBUMIN  Never done  . TETANUS/TDAP  Never done  . COVID-19 Vaccine (1) 11/22/2020 (Originally 11/13/1990)  . INFLUENZA VACCINE  11/23/2020  . HEMOGLOBIN A1C  02/09/2021  . PNEUMOCOCCAL POLYSACCHARIDE VACCINE AGE 75-64 HIGH RISK  Completed  . Hepatitis C Screening  Completed  . HIV Screening  Completed  . HPV VACCINES  Aged Out     ----------------------------------------------------------------------------------------------------------------------------------------------------------------------------------------------------------------- Physical Exam BP 136/84   Pulse 67   Temp (!) 97.1 F (36.2 C)   Ht 6\' 5"  (1.956 m)   Wt 263 lb 12.8 oz (119.7 kg)   SpO2 100%   BMI 31.28 kg/m   Physical Exam Constitutional:      Appearance: Normal appearance.  Cardiovascular:     Rate and Rhythm: Normal rate and regular rhythm.  Pulmonary:     Effort: Pulmonary effort is normal.     Breath sounds: Normal breath sounds.  Neurological:     Mental Status: He is alert.     ------------------------------------------------------------------------------------------------------------------------------------------------------------------------------------------------------------------- Assessment and Plan  Cutaneous sarcoidosis Will try triamcinolone.  If not  improving with this we can consider higher potency topical steroid and/or doxycycline.   Type 2 diabetes mellitus with hyperglycemia, without long-term current use of insulin (HCC) Most recent A1c of  Lab Results  Component Value Date   HGBA1C 4.6 08/10/2020   indicates diabetes is well controlled.  He will continue on metformin but we'll discontinue the ozempic for now.  Congratulated on weight loss and encouraged continue lifestyle  changes..  Counseled on healthy, low carb diet and recommend frequent activity to help with maintaining good control of blood sugars.    Essential hypertension Blood pressure is at goal at for age and co-morbidities.  He is doing well with lifestyle and dietary changes to improve this.  He will continue to monitor at home as well.     Meds ordered this encounter  Medications  . triamcinolone cream (KENALOG) 0.1 %    Sig: Apply 1 application topically 2 (two) times daily.    Dispense:  80 g    Refill:  0    Return in about 3 months (around 11/09/2020) for HTN/T2DM.    This visit occurred during the SARS-CoV-2 public health emergency.  Safety protocols were in place, including screening questions prior to the visit, additional usage of staff PPE, and extensive cleaning of exam room while observing appropriate contact time as indicated for disinfecting solutions.

## 2020-08-10 NOTE — Assessment & Plan Note (Signed)
Will try triamcinolone.  If not improving with this we can consider higher potency topical steroid and/or doxycycline.

## 2020-08-10 NOTE — Assessment & Plan Note (Signed)
Most recent A1c of  Lab Results  Component Value Date   HGBA1C 4.6 08/10/2020   indicates diabetes is well controlled.  He will continue on metformin but we'll discontinue the ozempic for now.  Congratulated on weight loss and encouraged continue lifestyle changes..  Counseled on healthy, low carb diet and recommend frequent activity to help with maintaining good control of blood sugars.

## 2020-11-09 ENCOUNTER — Ambulatory Visit: Payer: BC Managed Care – PPO | Admitting: Family Medicine

## 2020-11-12 ENCOUNTER — Encounter: Payer: Self-pay | Admitting: Family Medicine

## 2020-11-12 ENCOUNTER — Ambulatory Visit (INDEPENDENT_AMBULATORY_CARE_PROVIDER_SITE_OTHER): Payer: BC Managed Care – PPO | Admitting: Family Medicine

## 2020-11-12 ENCOUNTER — Other Ambulatory Visit: Payer: Self-pay

## 2020-11-12 VITALS — BP 135/83 | HR 70 | Temp 97.7°F | Ht 77.0 in | Wt 265.0 lb

## 2020-11-12 DIAGNOSIS — I10 Essential (primary) hypertension: Secondary | ICD-10-CM

## 2020-11-12 DIAGNOSIS — Z3009 Encounter for other general counseling and advice on contraception: Secondary | ICD-10-CM

## 2020-11-12 DIAGNOSIS — Z23 Encounter for immunization: Secondary | ICD-10-CM | POA: Diagnosis not present

## 2020-11-12 DIAGNOSIS — E1165 Type 2 diabetes mellitus with hyperglycemia: Secondary | ICD-10-CM

## 2020-11-12 DIAGNOSIS — E781 Pure hyperglyceridemia: Secondary | ICD-10-CM | POA: Diagnosis not present

## 2020-11-12 MED ORDER — OZEMPIC (0.25 OR 0.5 MG/DOSE) 2 MG/1.5ML ~~LOC~~ SOPN: PEN_INJECTOR | SUBCUTANEOUS | 1 refills | Status: DC

## 2020-11-12 NOTE — Assessment & Plan Note (Signed)
Referral placed to urology.

## 2020-11-12 NOTE — Assessment & Plan Note (Signed)
He is starting to gain some weight back.  I am going to have him add Ozempic back on.  Continue metformin.  Update a1c and microalbumin.

## 2020-11-12 NOTE — Progress Notes (Signed)
Eduardo Parker - 40 y.o. male MRN [id number]  Date of birth: 1980/05/06  Subjective Chief Complaint  Patient presents with   Hypertension   Diabetes    HPI Eduardo Parker is a 40 y.o. male here today for follow up visit.  He reports that he is doing pretty well.  He has gained some weight back since his last visit.  He was previously on Ozempic and tolerated this well.  He is currently taking metformin. No side effects with this.    Bp has been well managed without medication.  He has been following a low sodium diet.    He would like referral for vasectomy as well.   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  Social History   Socioeconomic History   Marital status: Single    Spouse name: Not on file   Number of children: Not on file   Years of education: Not on file   Highest education level: Not on file  Occupational History   Not on file  Tobacco Use   Smoking status: Never   Smokeless tobacco: Never  Substance and Sexual Activity   Alcohol use: Yes   Drug use: No   Sexual activity: Yes    Birth control/protection: None  Other Topics Concern   Not on file  Social History Narrative   Not on file   Social Determinants of Health   Financial Resource Strain: Not on file  Food Insecurity: Not on file  Transportation Needs: Not on file  Physical Activity: Not on file  Stress: Not on file  Social Connections: Not on file    Family History  Problem Relation Age of Onset   Breast cancer Mother    Depression Mother    Depression Father    Colon polyps Father    Colon cancer Paternal Grandfather     Health Maintenance  Topic  Date Due   OPHTHALMOLOGY EXAM  Never done   URINE MICROALBUMIN  Never done   COVID-19 Vaccine (1) 11/22/2020 (Originally 11/13/1983)   INFLUENZA VACCINE  11/23/2020   HEMOGLOBIN A1C  02/09/2021   Pneumococcal Vaccine 62-69 Years old (2 - PCV) 05/04/2021   FOOT EXAM  11/12/2021   TETANUS/TDAP  11/13/2030   PNEUMOCOCCAL POLYSACCHARIDE VACCINE AGE 60-64 HIGH RISK  Completed   Hepatitis C Screening  Completed   HIV Screening  Completed   HPV VACCINES  Aged Out     ----------------------------------------------------------------------------------------------------------------------------------------------------------------------------------------------------------------- Physical Exam BP 135/83 (BP Location: Left Arm, Patient Position: Sitting, Cuff Size: Large)   Pulse 70   Temp 97.7 F (36.5 C)   Ht 6\' 5"  (1.956 m)   Wt 265 lb (120.2 kg)   SpO2 100%   BMI 31.42 kg/m   Physical Exam Constitutional:      Appearance: Normal appearance.  HENT:     Head: Normocephalic and atraumatic.  Eyes:     General: No scleral icterus. Cardiovascular:     Rate and Rhythm: Normal rate and regular rhythm.  Pulmonary:     Effort: Pulmonary effort is normal.     Breath sounds: Normal breath sounds.  Musculoskeletal:  Cervical back: Neck supple.  Skin:    General: Skin is warm and dry.  Neurological:     General: No focal deficit present.     Mental Status: He is alert.  Psychiatric:        Mood and Affect: Mood normal.        Behavior: Behavior normal.    ------------------------------------------------------------------------------------------------------------------------------------------------------------------------------------------------------------------- Assessment and Plan  Essential hypertension BP remains fairly well controlled.  Would expect this to improve with continued weight loss.  Low sodium diet recommended.   Type 2 diabetes mellitus with hyperglycemia, without  long-term current use of insulin (HCC) He is starting to gain some weight back.  I am going to have him add Ozempic back on.  Continue metformin.  Update a1c and microalbumin.      Hypertriglyceridemia without hypercholesterolemia Update lipid panel.   Vasectomy evaluation Referral placed to urology.    Meds ordered this encounter  Medications   Semaglutide,0.25 or 0.5MG /DOS, (OZEMPIC, 0.25 OR 0.5 MG/DOSE,) 2 MG/1.5ML SOPN    Sig: Inject 0.25mg  weekly x4 weeks then increase to 0.5mg  weekly    Dispense:  3 mL    Refill:  1    Return in about 4 months (around 03/15/2021) for T2DM.    This visit occurred during the SARS-CoV-2 public health emergency.  Safety protocols were in place, including screening questions prior to the visit, additional usage of staff PPE, and extensive cleaning of exam room while observing appropriate contact time as indicated for disinfecting solutions.

## 2020-11-12 NOTE — Assessment & Plan Note (Signed)
Update lipid panel.  

## 2020-11-12 NOTE — Patient Instructions (Signed)
Go ahead and restart Ozempic.  Continue metformin at current strength.  We'll be in touch with lab results.  Follow up in 4 months.

## 2020-11-12 NOTE — Assessment & Plan Note (Signed)
BP remains fairly well controlled.  Would expect this to improve with continued weight loss.  Low sodium diet recommended.

## 2020-11-13 LAB — MICROALBUMIN / CREATININE URINE RATIO
Creatinine, Urine: 95 mg/dL (ref 20–320)
Microalb Creat Ratio: 11 mcg/mg creat (ref <30)
Microalb, Ur: 1 mg/dL

## 2020-11-13 LAB — COMPLETE METABOLIC PANEL WITH GFR
AG Ratio: 1.9 (calc) (ref 1.0–2.5)
ALT: 62 U/L — ABNORMAL HIGH (ref 9–46)
AST: 40 U/L (ref 10–40)
Albumin: 4.8 g/dL (ref 3.6–5.1)
Alkaline phosphatase (APISO): 69 U/L (ref 36–130)
BUN: 16 mg/dL (ref 7–25)
CO2: 27 mmol/L (ref 20–32)
Calcium: 10.6 mg/dL — ABNORMAL HIGH (ref 8.6–10.3)
Chloride: 104 mmol/L (ref 98–110)
Creat: 1.18 mg/dL (ref 0.60–1.29)
Globulin: 2.5 g/dL (calc) (ref 1.9–3.7)
Glucose, Bld: 111 mg/dL — ABNORMAL HIGH (ref 65–99)
Potassium: 3.9 mmol/L (ref 3.5–5.3)
Sodium: 139 mmol/L (ref 135–146)
Total Bilirubin: 1.3 mg/dL — ABNORMAL HIGH (ref 0.2–1.2)
Total Protein: 7.3 g/dL (ref 6.1–8.1)
eGFR: 79 mL/min/{1.73_m2} (ref >=60)

## 2020-11-13 LAB — LIPID PANEL W/REFLEX DIRECT LDL
Cholesterol: 196 mg/dL (ref <200)
HDL: 43 mg/dL (ref >=40)
LDL Cholesterol (Calc): 115 mg/dL (calc) — ABNORMAL HIGH
Non-HDL Cholesterol (Calc): 153 mg/dL (calc) — ABNORMAL HIGH (ref <130)
Total CHOL/HDL Ratio: 4.6 (calc) (ref <5.0)
Triglycerides: 265 mg/dL — ABNORMAL HIGH (ref <150)

## 2020-11-13 LAB — HEMOGLOBIN A1C
Hgb A1c MFr Bld: 4.6 % of total Hgb (ref <5.7)
Mean Plasma Glucose: 85 mg/dL
eAG (mmol/L): 4.7 mmol/L

## 2020-11-26 ENCOUNTER — Other Ambulatory Visit: Payer: Self-pay | Admitting: Family Medicine

## 2020-11-26 MED ORDER — ROSUVASTATIN CALCIUM 10 MG PO TABS: 10.0000 mg | ORAL_TABLET | Freq: Every day | ORAL | 3 refills | Status: DC

## 2020-12-10 ENCOUNTER — Other Ambulatory Visit: Payer: Self-pay | Admitting: Family Medicine

## 2020-12-15 ENCOUNTER — Telehealth: Payer: Self-pay

## 2020-12-15 NOTE — Telephone Encounter (Signed)
Pt lvm requesting dosage clarification for Ozempic refill he just received.   Returned pt's call. LVM advising pt should be on 0.5mg  dosage for the next 4 weeks provided he finished taking 0.25mg  dose for the previous 4 weeks.   Callback information provided for questions.

## 2020-12-30 ENCOUNTER — Other Ambulatory Visit: Payer: Self-pay | Admitting: Family Medicine

## 2020-12-30 DIAGNOSIS — F411 Generalized anxiety disorder: Secondary | ICD-10-CM

## 2021-01-03 ENCOUNTER — Other Ambulatory Visit: Payer: Self-pay | Admitting: Family Medicine

## 2021-03-15 ENCOUNTER — Ambulatory Visit: Payer: BC Managed Care – PPO | Admitting: Family Medicine

## 2021-03-15 ENCOUNTER — Encounter: Payer: Self-pay | Admitting: Family Medicine

## 2021-03-15 ENCOUNTER — Other Ambulatory Visit: Payer: Self-pay | Admitting: Family Medicine

## 2021-03-15 ENCOUNTER — Other Ambulatory Visit: Payer: Self-pay

## 2021-03-15 VITALS — BP 138/80 | HR 75 | Temp 97.9°F | Ht 77.0 in | Wt 262.0 lb

## 2021-03-15 DIAGNOSIS — E1165 Type 2 diabetes mellitus with hyperglycemia: Secondary | ICD-10-CM | POA: Diagnosis not present

## 2021-03-15 DIAGNOSIS — F1020 Alcohol dependence, uncomplicated: Secondary | ICD-10-CM

## 2021-03-15 DIAGNOSIS — I10 Essential (primary) hypertension: Secondary | ICD-10-CM | POA: Diagnosis not present

## 2021-03-15 LAB — POCT GLYCOSYLATED HEMOGLOBIN (HGB A1C): HbA1c, POC (controlled diabetic range): 4.3 % (ref 0.0–7.0)

## 2021-03-15 MED ORDER — OZEMPIC (0.25 OR 0.5 MG/DOSE) 2 MG/1.5ML ~~LOC~~ SOPN: 0.5000 mg | PEN_INJECTOR | SUBCUTANEOUS | 1 refills | Status: DC

## 2021-03-15 NOTE — Patient Instructions (Signed)
Great to see yout today! Let's drop the metformin  Continue with lifestyle change You may continue on the Ozempic for weight management.  Try the coupon.

## 2021-03-15 NOTE — Assessment & Plan Note (Signed)
He has cut back quite a bit on alcohol use.  Encouraged to continue to cut back with going quitting.

## 2021-03-15 NOTE — Assessment & Plan Note (Addendum)
Blood pressure remains well controlled at this time.  He is off medication at this time.

## 2021-03-15 NOTE — Progress Notes (Signed)
Eduardo Parker - 40 y.o. male MRN [id number]  Date of birth: 01-Jul-1980  Subjective Chief Complaint  Patient presents with   Diabetes    HPI Eduardo Parker is a 40 year old male here today for follow-up visit.  He reports that he continues to do well with current medications.  He is interested in coming off of metformin if possible.  He continues to exercise regularly feels like his diet is pretty good.  He is not monitoring blood sugars at home regularly.  Tolerating Crestor well for management of hyperlipidemia.  Blood pressure remains well controlled.  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 No pertinent surgical history.  Social History   Socioeconomic History   Marital status: Single    Spouse name: Not on file   Number of children: Not on file   Years of education: Not on file   Highest education level: Not on file  Occupational History   Not on file  Tobacco Use   Smoking status: Never   Smokeless tobacco: Never  Substance and Sexual Activity   Alcohol use: Yes   Drug use: No   Sexual activity: Yes    Birth control/protection: None  Other Topics Concern   Not on file  Social History Narrative   Not on file   Social Determinants of Health   Financial Resource Strain: Not on file  Food Insecurity: Not on file  Transportation Needs: Not on file  Physical Activity: Not on file  Stress: Not on file  Social Connections: Not on file    Family History  Problem Relation Age of Onset   Breast cancer Mother    Depression Mother    Depression Father    Colon polyps Father    Colon cancer Paternal Grandfather     Health Maintenance  Topic Date Due    COVID-19 Vaccine (1) Never done   INFLUENZA VACCINE  Never done   Pneumococcal Vaccine 75-5 Years old (2 - PCV) 05/04/2021   OPHTHALMOLOGY EXAM  05/27/2021   HEMOGLOBIN A1C  09/12/2021   FOOT EXAM  11/12/2021   URINE MICROALBUMIN  11/12/2021   TETANUS/TDAP  11/13/2030   Hepatitis C Screening  Completed   HIV Screening  Completed   HPV VACCINES  Aged Out     ----------------------------------------------------------------------------------------------------------------------------------------------------------------------------------------------------------------- Physical Exam BP 138/80 (BP Location: Left Arm, Patient Position: Sitting, Cuff Size: Large)   Pulse 75   Temp 97.9 F (36.6 C)   Ht 6\' 5"  (1.956 m)   Wt 262 lb (118.8 kg)   SpO2 99%   BMI 31.07 kg/m   Physical Exam Constitutional:      Appearance: Normal appearance.  HENT:     Head: Normocephalic and atraumatic.  Eyes:     General: No scleral icterus. Cardiovascular:     Rate and Rhythm: Normal rate and regular rhythm.  Pulmonary:     Effort: Pulmonary effort is normal.     Breath sounds: Normal breath sounds.  Musculoskeletal:     Cervical back: Neck supple.  Neurological:     General: No focal deficit present.     Mental Status: He is alert.  Psychiatric:  Mood and Affect: Mood normal.        Behavior: Behavior normal.    ------------------------------------------------------------------------------------------------------------------------------------------------------------------------------------------------------------------- Assessment and Plan  Essential hypertension Blood pressure remains well controlled at this time.  He is off medication at this time.  Type 2 diabetes mellitus with hyperglycemia, without long-term current use of insulin (HCC) Excellent control diabetes at this time.  He may discontinue metformin.  He would like to stay on Ozempic to help with continued weight loss.   Follow-up in 6 months.  Alcohol use disorder, moderate, dependence (HCC) He has cut back quite a bit on alcohol use.  Encouraged to continue to cut back with going quitting.   Meds ordered this encounter  Medications   Semaglutide,0.25 or 0.5MG /DOS, (OZEMPIC, 0.25 OR 0.5 MG/DOSE,) 2 MG/1.5ML SOPN    Sig: Inject 0.5 mg into the skin once a week.    Dispense:  4.5 mL    Refill:  1    Return in about 6 months (around 09/12/2021) for DM.    This visit occurred during the SARS-CoV-2 public health emergency.  Safety protocols were in place, including screening questions prior to the visit, additional usage of staff PPE, and extensive cleaning of exam room while observing appropriate contact time as indicated for disinfecting solutions.

## 2021-03-15 NOTE — Assessment & Plan Note (Signed)
Excellent control diabetes at this time.  He may discontinue metformin.  He would like to stay on Ozempic to help with continued weight loss.  Follow-up in 6 months.

## 2021-03-16 ENCOUNTER — Other Ambulatory Visit: Payer: Self-pay | Admitting: Family Medicine

## 2021-04-19 ENCOUNTER — Other Ambulatory Visit: Payer: Self-pay | Admitting: Family Medicine

## 2021-05-31 ENCOUNTER — Other Ambulatory Visit: Payer: Self-pay | Admitting: Family Medicine

## 2021-05-31 DIAGNOSIS — F411 Generalized anxiety disorder: Secondary | ICD-10-CM

## 2021-08-24 ENCOUNTER — Telehealth: Payer: Self-pay

## 2021-08-24 NOTE — Telephone Encounter (Signed)
Initiated Prior authorization UVO:[id number] (0.25 or 0.5 MG/DOSE) 2MG /1.5ML pen-injectors ?Via: Covermymeds ?Case/Key:[id number] ?Status: approved as of 08/24/21 ?Reason:Effective from 08/24/2021 through 08/23/2022. ?Notified Pt via: Pt does not have Mychart, called pt,expressed understanding   ?

## 2021-09-13 ENCOUNTER — Ambulatory Visit: Payer: BC Managed Care – PPO | Admitting: Family Medicine

## 2021-09-13 ENCOUNTER — Encounter: Payer: Self-pay | Admitting: Family Medicine

## 2021-09-13 VITALS — BP 118/76 | HR 70 | Ht 77.0 in | Wt 270.0 lb

## 2021-09-13 DIAGNOSIS — F1020 Alcohol dependence, uncomplicated: Secondary | ICD-10-CM

## 2021-09-13 DIAGNOSIS — E1165 Type 2 diabetes mellitus with hyperglycemia: Secondary | ICD-10-CM | POA: Diagnosis not present

## 2021-09-13 DIAGNOSIS — I10 Essential (primary) hypertension: Secondary | ICD-10-CM | POA: Diagnosis not present

## 2021-09-13 DIAGNOSIS — E781 Pure hyperglyceridemia: Secondary | ICD-10-CM

## 2021-09-13 DIAGNOSIS — F411 Generalized anxiety disorder: Secondary | ICD-10-CM

## 2021-09-13 MED ORDER — VENLAFAXINE HCL ER 75 MG PO CP24: 75.0000 mg | ORAL_CAPSULE | Freq: Every day | ORAL | 3 refills | Status: DC

## 2021-09-13 NOTE — Assessment & Plan Note (Signed)
Doing well with crestor.  Updating lipid panel today.

## 2021-09-13 NOTE — Progress Notes (Signed)
Eduardo Parker - 41 y.o. male MRN [id number]  Date of birth: [birth year]  Subjective No chief complaint on file.   HPI Eduardo Parker is a 41 y.o. male here today for follow up visit.  Reports that overall he is feeling well.   He has been doing really well with controlling his diabetes.  At last visit we were able to drop his metformin as his a1c was 4.3%.  He has continued on Ozempic.  He continues to exercise regularly.    BP has been well controlled.  He is off of medications for HTN.  He denies chest pain, shortness of breath, palpitations, headache or vision changes.   He continues on crestor for management of HLD.  Tolerating well.   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  Social History   Socioeconomic History   Marital status: Single    Spouse name: Not on file   Number of children: Not on file   Years of education: Not on file   Highest education level: Not on file  Occupational History   Not on file  Tobacco Use   Smoking status: Never   Smokeless tobacco: Never  Substance and Sexual Activity   Alcohol use: Yes   Drug use: No   Sexual activity: Yes    Birth control/protection: None  Other Topics Concern   Not on file  Social History Narrative   Not on file   Social Determinants of Health   Financial Resource Strain: Not on file  Food Insecurity: Not on file  Transportation Needs: Not on file  Physical Activity: Not on file  Stress: Not on file  Social Connections: Not on file    Family History  Problem Relation Age of Onset   Breast cancer Mother    Depression Mother    Depression Father    Colon polyps Father     Colon cancer Paternal Grandfather     Health Maintenance  Topic Date Due   COVID-19 Vaccine (1) Never done   OPHTHALMOLOGY EXAM  05/27/2021   HEMOGLOBIN A1C  09/12/2021   URINE MICROALBUMIN  11/12/2021   FOOT EXAM  11/12/2021   INFLUENZA VACCINE  11/23/2021   TETANUS/TDAP  11/13/2030   Hepatitis C Screening  Completed   HIV Screening  Completed   HPV VACCINES  Aged Out     ----------------------------------------------------------------------------------------------------------------------------------------------------------------------------------------------------------------- Physical Exam BP 118/76 (BP Location: Left Arm, Patient Position: Sitting, Cuff Size: Large)   Pulse 70   Ht 6\' 5"  (1.956 m)   Wt 270 lb (122.5 kg)   SpO2 98%   BMI 32.02 kg/m   Physical Exam Constitutional:      Appearance: Normal appearance.  Eyes:     General: No scleral icterus. Cardiovascular:     Rate and Rhythm: Normal rate and regular rhythm.  Pulmonary:     Effort: Pulmonary effort is normal.     Breath sounds: Normal breath sounds.  Musculoskeletal:     Cervical back: Neck supple.  Neurological:     Mental Status: He is alert.  Psychiatric:        Mood and Affect: Mood normal.  Behavior: Behavior normal.    ------------------------------------------------------------------------------------------------------------------------------------------------------------------------------------------------------------------- Assessment and Plan  Essential hypertension BP remains well controlled off of medications.  Encouraged to stick with current lifestyle changes.   Type 2 diabetes mellitus with hyperglycemia, without long-term current use of insulin (HCC) He has done very well with keeping blood sugars under good control.  Weight is stable.  He would like to .   Updating A1c and additional labs today.   Hypertriglyceridemia without hypercholesterolemia Doing well with  crestor.  Updating lipid panel today.   Alcohol use disorder, moderate, dependence (HCC) He has cut back quite a bit on EtOH consumption.     No orders of the defined types were placed in this encounter.   No follow-ups on file.    This visit occurred during the SARS-CoV-2 public health emergency.  Safety protocols were in place, including screening questions prior to the visit, additional usage of staff PPE, and extensive cleaning of exam room while observing appropriate contact time as indicated for disinfecting solutions.

## 2021-09-13 NOTE — Assessment & Plan Note (Signed)
He has done very well with keeping blood sugars under good control.  Weight is stable.  He would like to .   Updating A1c and additional labs today.

## 2021-09-13 NOTE — Assessment & Plan Note (Signed)
He has cut back quite a bit on EtOH consumption.

## 2021-09-13 NOTE — Assessment & Plan Note (Signed)
BP remains well controlled off of medications.  Encouraged to stick with current lifestyle changes.

## 2021-09-14 LAB — COMPLETE METABOLIC PANEL WITH GFR
AG Ratio: 1.8 (calc) (ref 1.0–2.5)
ALT: 56 U/L — ABNORMAL HIGH (ref 9–46)
AST: 39 U/L (ref 10–40)
Albumin: 4.8 g/dL (ref 3.6–5.1)
Alkaline phosphatase (APISO): 74 U/L (ref 36–130)
BUN/Creatinine Ratio: 11 (calc) (ref 6–22)
BUN: 15 mg/dL (ref 7–25)
CO2: 27 mmol/L (ref 20–32)
Calcium: 10 mg/dL (ref 8.6–10.3)
Chloride: 104 mmol/L (ref 98–110)
Creat: 1.34 mg/dL — ABNORMAL HIGH (ref 0.60–1.29)
Globulin: 2.6 g/dL (calc) (ref 1.9–3.7)
Glucose, Bld: 86 mg/dL (ref 65–99)
Potassium: 4.4 mmol/L (ref 3.5–5.3)
Sodium: 139 mmol/L (ref 135–146)
Total Bilirubin: 0.9 mg/dL (ref 0.2–1.2)
Total Protein: 7.4 g/dL (ref 6.1–8.1)
eGFR: 68 mL/min/{1.73_m2} (ref >=60)

## 2021-09-14 LAB — CBC WITH DIFFERENTIAL/PLATELET
Absolute Monocytes: 546 cells/uL (ref 200–950)
Basophils Absolute: 71 cells/uL (ref 0–200)
Basophils Relative: 1.7 %
Eosinophils Absolute: 256 cells/uL (ref 15–500)
Eosinophils Relative: 6.1 %
HCT: 45.8 % (ref 38.5–50.0)
Hemoglobin: 15.4 g/dL (ref 13.2–17.1)
Lymphs Abs: 428 cells/uL — ABNORMAL LOW (ref 850–3900)
MCH: 31.3 pg (ref 27.0–33.0)
MCHC: 33.6 g/dL (ref 32.0–36.0)
MCV: 93.1 fL (ref 80.0–100.0)
MPV: 10.9 fL (ref 7.5–12.5)
Monocytes Relative: 13 %
Neutro Abs: 2898 cells/uL (ref 1500–7800)
Neutrophils Relative %: 69 %
Platelets: 242 10*3/uL (ref 140–400)
RBC: 4.92 10*6/uL (ref 4.20–5.80)
RDW: 12.6 % (ref 11.0–15.0)
Total Lymphocyte: 10.2 %
WBC: 4.2 10*3/uL (ref 3.8–10.8)

## 2021-09-14 LAB — HEMOGLOBIN A1C
Hgb A1c MFr Bld: 4.8 % of total Hgb (ref <5.7)
Mean Plasma Glucose: 91 mg/dL
eAG (mmol/L): 5 mmol/L

## 2021-09-14 LAB — LIPID PANEL W/REFLEX DIRECT LDL
Cholesterol: 108 mg/dL (ref <200)
HDL: 28 mg/dL — ABNORMAL LOW (ref >=40)
LDL Cholesterol (Calc): 49 mg/dL (calc)
Non-HDL Cholesterol (Calc): 80 mg/dL (calc) (ref <130)
Total CHOL/HDL Ratio: 3.9 (calc) (ref <5.0)
Triglycerides: 261 mg/dL — ABNORMAL HIGH (ref <150)

## 2021-12-06 ENCOUNTER — Other Ambulatory Visit: Payer: Self-pay | Admitting: Family Medicine

## 2022-01-12 DIAGNOSIS — Z3009 Encounter for other general counseling and advice on contraception: Secondary | ICD-10-CM | POA: Diagnosis not present

## 2022-02-15 DIAGNOSIS — Z3009 Encounter for other general counseling and advice on contraception: Secondary | ICD-10-CM | POA: Diagnosis not present

## 2022-02-15 DIAGNOSIS — Z302 Encounter for sterilization: Secondary | ICD-10-CM | POA: Diagnosis not present

## 2022-02-21 DIAGNOSIS — N5082 Scrotal pain: Secondary | ICD-10-CM | POA: Diagnosis not present

## 2022-02-21 DIAGNOSIS — Z3009 Encounter for other general counseling and advice on contraception: Secondary | ICD-10-CM | POA: Diagnosis not present

## 2022-03-02 DIAGNOSIS — Z3009 Encounter for other general counseling and advice on contraception: Secondary | ICD-10-CM | POA: Diagnosis not present

## 2022-03-02 DIAGNOSIS — N5082 Scrotal pain: Secondary | ICD-10-CM | POA: Diagnosis not present

## 2022-03-02 DIAGNOSIS — N503 Cyst of epididymis: Secondary | ICD-10-CM | POA: Diagnosis not present

## 2022-03-02 DIAGNOSIS — N432 Other hydrocele: Secondary | ICD-10-CM | POA: Diagnosis not present

## 2022-03-16 ENCOUNTER — Ambulatory Visit: Payer: BC Managed Care – PPO | Admitting: Family Medicine

## 2022-03-16 ENCOUNTER — Encounter: Payer: Self-pay | Admitting: Family Medicine

## 2022-03-16 VITALS — BP 120/80 | HR 70 | Ht 77.0 in | Wt 279.9 lb

## 2022-03-16 DIAGNOSIS — E1165 Type 2 diabetes mellitus with hyperglycemia: Secondary | ICD-10-CM

## 2022-03-16 DIAGNOSIS — F411 Generalized anxiety disorder: Secondary | ICD-10-CM

## 2022-03-16 DIAGNOSIS — I10 Essential (primary) hypertension: Secondary | ICD-10-CM | POA: Diagnosis not present

## 2022-03-16 DIAGNOSIS — E781 Pure hyperglyceridemia: Secondary | ICD-10-CM | POA: Diagnosis not present

## 2022-03-16 DIAGNOSIS — F341 Dysthymic disorder: Secondary | ICD-10-CM

## 2022-03-16 MED ORDER — OZEMPIC (0.25 OR 0.5 MG/DOSE) 2 MG/3ML ~~LOC~~ SOPN: PEN_INJECTOR | SUBCUTANEOUS | 0 refills | Status: DC

## 2022-03-16 MED ORDER — VENLAFAXINE HCL ER 150 MG PO CP24: 150.0000 mg | ORAL_CAPSULE | Freq: Every day | ORAL | 1 refills | Status: DC

## 2022-03-16 MED ORDER — OZEMPIC (0.25 OR 0.5 MG/DOSE) 2 MG/3ML ~~LOC~~ SOPN: 0.5000 mg | PEN_INJECTOR | SUBCUTANEOUS | 1 refills | Status: DC

## 2022-03-16 NOTE — Progress Notes (Signed)
Eduardo Parker - 41 y.o. male MRN [id number]  Date of birth: 1981/04/22  Subjective Chief Complaint  Patient presents with   Follow-up    HPI Eduardo Parker is a 41 y.o. male here today for follow up visit.   He was doing very well with Ozempic previously for management of his diabetes.  Unfortunately the stock diminished at his pharmacy and he was unable to continue on this.  He is planning on changing pharmacies.  Felt more motivated to exercise and work on his diet previously.  Has put some weight back on.    He also has had feeling of worsening depression. He is currently on effexor 75mg  daily which has worked well for him traditionally.  He would like to try increasing this.   Tolerating crestor well for HLD.   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 No pertinent surgical history.  Social History   Socioeconomic History   Marital status: Single    Spouse name: Not on file   Number of children: Not on file   Years of education: Not on file   Highest education level: Not on file  Occupational History   Not on file  Tobacco Use   Smoking status: Never   Smokeless tobacco: Never  Substance and Sexual Activity   Alcohol use: Yes   Drug use: No   Sexual activity: Yes    Birth control/protection: None  Other Topics Concern   Not on file  Social History Narrative   Not on file   Social Determinants of Health   Financial Resource Strain: Not on file  Food Insecurity: Not on file  Transportation Needs: Not on file  Physical Activity: Not on file  Stress: Not on file  Social Connections: Not on file    Family History  Problem Relation Age of  Onset   Breast cancer Mother    Depression Mother    Depression Father    Colon polyps Father    Colon cancer Paternal Grandfather     Health Maintenance  Topic Date Due   Diabetic kidney evaluation - Urine ACR  11/12/2021   HEMOGLOBIN A1C  03/16/2022   OPHTHALMOLOGY EXAM  05/26/2022 (Originally 05/27/2021)   COVID-19 Vaccine (1) 05/26/2022 (Originally 05/16/1981)   INFLUENZA VACCINE  07/24/2022 (Originally 11/23/2021)   Diabetic kidney evaluation - GFR measurement  09/14/2022   FOOT EXAM  09/14/2022   Hepatitis C Screening  Completed   HIV Screening  Completed   HPV VACCINES  Aged Out     ----------------------------------------------------------------------------------------------------------------------------------------------------------------------------------------------------------------- Physical Exam BP 120/80 (BP Location: Left Arm, Patient Position: Sitting, Cuff Size: Large)   Pulse 70   Ht 6\' 5"  (1.956 m)   Wt 279 lb 14.4 oz (127 kg)   SpO2 97%   BMI 33.19 kg/m   Physical Exam Constitutional:      Appearance: Normal appearance.  HENT:     Head: Normocephalic and atraumatic.  Eyes:     General: No scleral icterus. Cardiovascular:     Rate and Rhythm: Normal rate and regular rhythm.  Musculoskeletal:     Cervical back: Neck supple.  Neurological:     Mental Status: He  is alert.  Psychiatric:        Mood and Affect: Mood normal.        Behavior: Behavior normal.     ------------------------------------------------------------------------------------------------------------------------------------------------------------------------------------------------------------------- Assessment and Plan  Essential hypertension BP remains stable.  Not currently on medications.   Type 2 diabetes mellitus with hyperglycemia, without long-term current use of insulin (HCC) He responded well to ozempic previously.  Given sample pen today and rx sent to a different  pharmacy.  If stock issues remain we may go ahead and have him try the 1mg  pen.   Dysthymia Worsened mood.  Increasing effexor xr to 150mg  daily.  Return in about 3 months (around 06/16/2022) for F/u T2DM.   Meds ordered this encounter  Medications   venlafaxine XR (EFFEXOR-XR) 150 MG 24 hr capsule    Sig: Take 1 capsule (150 mg total) by mouth daily with breakfast.    Dispense:  90 capsule    Refill:  1   OZEMPIC, 0.25 OR 0.5 MG/DOSE, 2 MG/3ML SOPN    Sig: Inject 0.5 mg into the skin once a week.    Dispense:  9 mL    Refill:  1   Semaglutide,0.25 or 0.5MG /DOS, (OZEMPIC, 0.25 OR 0.5 MG/DOSE,) 2 MG/3ML SOPN    Sig: Inject 0.25mg  x4 weeks then increase to 0.5mg  Lot: Exp: 05/26/2023    Dispense:  3 mL    Refill:  0    Return in about 3 months (around 06/16/2022) for F/u T2DM.    This visit occurred during the SARS-CoV-2 public health emergency.  Safety protocols were in place, including screening questions prior to the visit, additional usage of staff PPE, and extensive cleaning of exam room while observing appropriate contact time as indicated for disinfecting solutions.

## 2022-03-16 NOTE — Assessment & Plan Note (Signed)
BP remains stable.  Not currently on medications.

## 2022-03-16 NOTE — Assessment & Plan Note (Signed)
Worsened mood.  Increasing effexor xr to 150mg  daily.  Return in about 3 months (around 06/16/2022) for F/u T2DM.

## 2022-03-16 NOTE — Patient Instructions (Addendum)
Increase effexor 150mg  Restart ozempic.

## 2022-03-16 NOTE — Assessment & Plan Note (Signed)
He responded well to ozempic previously.  Given sample pen today and rx sent to a different pharmacy.  If stock issues remain we may go ahead and have him try the 1mg  pen.

## 2022-03-17 LAB — MICROALBUMIN / CREATININE URINE RATIO
Creatinine, Urine: 179 mg/dL (ref 20–320)
Microalb Creat Ratio: 12 mcg/mg creat (ref <30)
Microalb, Ur: 2.2 mg/dL

## 2022-03-17 LAB — CBC WITH DIFFERENTIAL/PLATELET
Absolute Monocytes: 403 cells/uL (ref 200–950)
Basophils Absolute: 59 cells/uL (ref 0–200)
Basophils Relative: 1.6 %
Eosinophils Absolute: 281 cells/uL (ref 15–500)
Eosinophils Relative: 7.6 %
HCT: 45.3 % (ref 38.5–50.0)
Hemoglobin: 15.5 g/dL (ref 13.2–17.1)
Lymphs Abs: 403 cells/uL — ABNORMAL LOW (ref 850–3900)
MCH: 31.6 pg (ref 27.0–33.0)
MCHC: 34.2 g/dL (ref 32.0–36.0)
MCV: 92.4 fL (ref 80.0–100.0)
MPV: 10.2 fL (ref 7.5–12.5)
Monocytes Relative: 10.9 %
Neutro Abs: 2553 cells/uL (ref 1500–7800)
Neutrophils Relative %: 69 %
Platelets: 211 10*3/uL (ref 140–400)
RBC: 4.9 10*6/uL (ref 4.20–5.80)
RDW: 13.1 % (ref 11.0–15.0)
Total Lymphocyte: 10.9 %
WBC: 3.7 10*3/uL — ABNORMAL LOW (ref 3.8–10.8)

## 2022-03-17 LAB — COMPLETE METABOLIC PANEL WITH GFR
AG Ratio: 1.8 (calc) (ref 1.0–2.5)
ALT: 52 U/L — ABNORMAL HIGH (ref 9–46)
AST: 43 U/L — ABNORMAL HIGH (ref 10–40)
Albumin: 4.8 g/dL (ref 3.6–5.1)
Alkaline phosphatase (APISO): 91 U/L (ref 36–130)
BUN: 13 mg/dL (ref 7–25)
CO2: 28 mmol/L (ref 20–32)
Calcium: 9.9 mg/dL (ref 8.6–10.3)
Chloride: 103 mmol/L (ref 98–110)
Creat: 1.07 mg/dL (ref 0.60–1.29)
Globulin: 2.6 g/dL (calc) (ref 1.9–3.7)
Glucose, Bld: 128 mg/dL — ABNORMAL HIGH (ref 65–99)
Potassium: 4.3 mmol/L (ref 3.5–5.3)
Sodium: 139 mmol/L (ref 135–146)
Total Bilirubin: 1.1 mg/dL (ref 0.2–1.2)
Total Protein: 7.4 g/dL (ref 6.1–8.1)
eGFR: 88 mL/min/{1.73_m2} (ref >=60)

## 2022-03-17 LAB — LIPID PANEL W/REFLEX DIRECT LDL
Cholesterol: 112 mg/dL (ref <200)
HDL: 32 mg/dL — ABNORMAL LOW (ref >=40)
LDL Cholesterol (Calc): 51 mg/dL (calc)
Non-HDL Cholesterol (Calc): 80 mg/dL (calc) (ref <130)
Total CHOL/HDL Ratio: 3.5 (calc) (ref <5.0)
Triglycerides: 218 mg/dL — ABNORMAL HIGH (ref <150)

## 2022-03-17 LAB — HEMOGLOBIN A1C
Hgb A1c MFr Bld: 5.6 % of total Hgb (ref <5.7)
Mean Plasma Glucose: 114 mg/dL
eAG (mmol/L): 6.3 mmol/L

## 2022-03-25 ENCOUNTER — Other Ambulatory Visit: Payer: Self-pay | Admitting: Family Medicine

## 2022-03-25 DIAGNOSIS — R7989 Other specified abnormal findings of blood chemistry: Secondary | ICD-10-CM

## 2022-03-25 DIAGNOSIS — E8881 Metabolic syndrome: Secondary | ICD-10-CM

## 2022-03-25 DIAGNOSIS — K700 Alcoholic fatty liver: Secondary | ICD-10-CM

## 2022-06-17 ENCOUNTER — Ambulatory Visit: Payer: BC Managed Care – PPO | Admitting: Family Medicine

## 2022-06-17 ENCOUNTER — Encounter: Payer: Self-pay | Admitting: Family Medicine

## 2022-06-17 VITALS — BP 113/76 | HR 69 | Ht 77.0 in | Wt 266.0 lb

## 2022-06-17 DIAGNOSIS — D863 Sarcoidosis of skin: Secondary | ICD-10-CM | POA: Diagnosis not present

## 2022-06-17 DIAGNOSIS — R200 Anesthesia of skin: Secondary | ICD-10-CM

## 2022-06-17 DIAGNOSIS — F1020 Alcohol dependence, uncomplicated: Secondary | ICD-10-CM

## 2022-06-17 DIAGNOSIS — E1165 Type 2 diabetes mellitus with hyperglycemia: Secondary | ICD-10-CM | POA: Diagnosis not present

## 2022-06-17 DIAGNOSIS — F341 Dysthymic disorder: Secondary | ICD-10-CM

## 2022-06-17 DIAGNOSIS — I10 Essential (primary) hypertension: Secondary | ICD-10-CM

## 2022-06-17 LAB — POCT GLYCOSYLATED HEMOGLOBIN (HGB A1C): HbA1c, POC (controlled diabetic range): 4.9 % (ref 0.0–7.0)

## 2022-06-17 NOTE — Assessment & Plan Note (Signed)
Diabetes is well controlled.  Weight is improved.  Continue semaglutide at current strength.

## 2022-06-17 NOTE — Assessment & Plan Note (Signed)
Increase in effexor seems to be effective.  Recommend continuation at current strength.

## 2022-06-17 NOTE — Assessment & Plan Note (Signed)
BP remains well controlled off medication.

## 2022-06-17 NOTE — Assessment & Plan Note (Signed)
He has cut back quite a bit, encouraged to not exceed more than 1-2 drinks per day.

## 2022-06-17 NOTE — Assessment & Plan Note (Signed)
Referral placed to rheumatology

## 2022-06-17 NOTE — Progress Notes (Signed)
Eduardo Parker - 42 y.o. male MRN [id number]  Date of birth: Jan 13, 1981  Subjective Chief Complaint  Patient presents with   Diabetes    HPI Eduardo Parker is a 42 y.o. male here today for follow up visit.   He has a history of T2DM.  This is managed with semaglutide 0.'5mg'$  weekly.  He has done quite well with this.  He denies side effects from this at current strength.  Tolerating rosuvastatin for management of co-existing HLD.  He has cut back significantly on his EtOH use.  TG have remained elevated but LDL is well controlled.   Remains on Effexor for depressed mood and anxiety.  This was increased to '150mg'$  at last visit.  Reports that this seems to be working pretty well.  He has not experienced significant side effects with this at current strength.    Having tingling in the palm and digits of the L hand.  He does work at a Teaching laboratory technician throughout the day.  Denies weakness in the hand.   He would like referral to rheumatology for his sarcoid.  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 No pertinent surgical history.  Social History   Socioeconomic History   Marital status: Single    Spouse name: Not on file   Number of children: Not on file   Years of education: Not on file   Highest education level: Not on file  Occupational History   Not on file  Tobacco Use   Smoking status: Never   Smokeless tobacco: Never  Substance and Sexual Activity   Alcohol use: Yes   Drug use: No   Sexual activity: Yes    Birth control/protection: None  Other Topics Concern   Not on file  Social History Narrative   Not on file   Social Determinants of Health   Financial  Resource Strain: Not on file  Food Insecurity: Not on file  Transportation Needs: Not on file  Physical Activity: Not on file  Stress: Not on file  Social Connections: Not on file    Family History  Problem Relation Age of Onset   Breast cancer Mother    Depression Mother    Depression Father    Colon polyps Father    Colon cancer Paternal Grandfather     Health Maintenance  Topic Date Due   INFLUENZA VACCINE  07/24/2022 (Originally 11/23/2021)   OPHTHALMOLOGY EXAM  09/24/2022 (Originally 05/27/2021)   COVID-19 Vaccine (1) 07/03/2023 (Originally 05/16/1981)   FOOT EXAM  09/14/2022   HEMOGLOBIN A1C  12/16/2022   Diabetic kidney evaluation - eGFR measurement  03/17/2023   Diabetic kidney evaluation - Urine ACR  03/17/2023   DTaP/Tdap/Td (2 - Td or Tdap) 11/13/2030   Hepatitis C Screening  Completed   HIV Screening  Completed   HPV VACCINES  Aged Out     ----------------------------------------------------------------------------------------------------------------------------------------------------------------------------------------------------------------- Physical Exam BP 113/76 (BP Location: Left Arm, Patient Position: Sitting, Cuff Size: Large)   Pulse 69   Ht '6\' 5"'$  (1.956 m)   Wt 266 lb (120.7 kg)   SpO2 99%   BMI 31.54 kg/m   Physical Exam Constitutional:      Appearance:  Normal appearance.  HENT:     Head: Normocephalic and atraumatic.  Cardiovascular:     Rate and Rhythm: Normal rate and regular rhythm.  Pulmonary:     Effort: Pulmonary effort is normal.     Breath sounds: Normal breath sounds.  Musculoskeletal:     Cervical back: Neck supple.  Neurological:     Mental Status: He is alert.     ------------------------------------------------------------------------------------------------------------------------------------------------------------------------------------------------------------------- Assessment and Plan  Essential hypertension BP  remains well controlled off medication.   Type 2 diabetes mellitus with hyperglycemia, without long-term current use of insulin ([place]) Diabetes is well controlled.  Weight is improved.  Continue semaglutide at current strength.    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   Alcohol use disorder, moderate, dependence ([place]) He has cut back quite a bit, encouraged to not exceed more than 1-2 drinks per day.   Dysthymia Increase in effexor seems to be effective.  Recommend continuation at current strength.   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  Discussed working on Personal assistant of his work station.   He will let me know if this worsens.    No orders of the defined types were placed in this encounter.   Return in about 6 months (around 12/16/2022) for T2DM.    This visit occurred during the SARS-CoV-2 public health emergency.  Safety protocols were in place, including screening questions prior to the visit, additional usage of staff PPE, and extensive cleaning of exam room while observing appropriate contact time as indicated for disinfecting solutions.

## 2022-06-17 NOTE — Assessment & Plan Note (Signed)
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  Discussed working on Personal assistant of his work station.   He will let me know if this worsens.

## 2022-07-18 DIAGNOSIS — Z3009 Encounter for other general counseling and advice on contraception: Secondary | ICD-10-CM | POA: Diagnosis not present

## 2022-08-25 ENCOUNTER — Encounter: Payer: Self-pay | Admitting: Internal Medicine

## 2022-08-25 ENCOUNTER — Ambulatory Visit: Payer: BC Managed Care – PPO | Attending: Internal Medicine | Admitting: Internal Medicine

## 2022-08-25 VITALS — BP 133/87 | HR 71 | Resp 16 | Ht 76.75 in | Wt 255.0 lb

## 2022-08-25 DIAGNOSIS — Z79899 Other long term (current) drug therapy: Secondary | ICD-10-CM

## 2022-08-25 DIAGNOSIS — K700 Alcoholic fatty liver: Secondary | ICD-10-CM | POA: Diagnosis not present

## 2022-08-25 DIAGNOSIS — E559 Vitamin D deficiency, unspecified: Secondary | ICD-10-CM | POA: Diagnosis not present

## 2022-08-25 DIAGNOSIS — I888 Other nonspecific lymphadenitis: Secondary | ICD-10-CM | POA: Diagnosis not present

## 2022-08-25 DIAGNOSIS — D863 Sarcoidosis of skin: Secondary | ICD-10-CM | POA: Diagnosis not present

## 2022-08-25 NOTE — Progress Notes (Signed)
Office Visit Note  Patient: Eduardo Parker             Date of Birth: Aug 26, 1980           MRN: [id number]             PCP: Everrett Coombe, DO Referring: Everrett Coombe, DO Visit Date: 08/25/2022   Subjective:  New Patient (Initial Visit) (Patient states he has a rash-type thing on his back. Patient states he has not been sleeping well. Patient states sometime he has to get his ears flushed.)   History of Present Illness: Eduardo Parker is a 42 y.o. male here for sarcoidosis.  He was originally diagnosed with sarcoidosis after hospitalization for acute kidney injury with hypercalcemia and diffuse lymphadenopathy with granulomatous lymphadenitis findings on biopsy in July 2020.  Was also found to have some papular dermatitis involvement as well as left I inflammation with decreased visual acuity.  He was treated with a course of oral steroids with improvement of his lymphadenopathy symptoms. He saw Dr. Sherryll Burger with findings indicative for inflammatory eye disease and was started on treatment with methotrexate but looks like he did not stay on the medication longer than 2 months.  He reports seeing no appreciable difference in symptoms when taking the medication.  He is pretty much had persistent painless lymph node swelling in multiple areas and decreased visual acuity in the left eye.  In the past few months skin rash has become more extensive now covering most of his back he was prescribed topical steroids for use but does not find is feasible over the large surface area have not seen a great response with the use so far.  He reports never having good exercise tolerance going back years but no particular cough or worsened dyspnea on exertion.  No joint pain or swelling.  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   HBV/HCV neg 04/2019  Activities of Daily Living:  Patient reports morning stiffness for 0 minute.    Patient Denies nocturnal pain.  Difficulty dressing/grooming: Denies Difficulty climbing stairs: Denies Difficulty getting out of chair: Denies Difficulty using hands for taps, buttons, cutlery, and/or writing: Denies  Review of Systems  Constitutional:  Positive for fatigue.  HENT:  Negative for mouth sores and mouth dryness.   Eyes:  Negative for dryness.  Respiratory:  Negative for shortness of breath.   Cardiovascular:  Negative for chest pain and palpitations.  Gastrointestinal:  Negative for blood in stool, constipation and diarrhea.  Endocrine: Negative for increased urination.  Genitourinary:  Negative for involuntary urination.  Musculoskeletal:  Positive for myalgias and myalgias. Negative for joint pain, gait problem, joint pain, joint swelling, muscle weakness, morning stiffness and muscle tenderness.  Skin:  Negative for color change, rash, hair loss and sensitivity to sunlight.  Allergic/Immunologic: Negative for susceptible to infections.  Neurological:  Positive for dizziness and headaches.  Hematological:  Positive for swollen glands.  Psychiatric/Behavioral:  Positive for depressed mood and sleep disturbance. The patient is nervous/anxious.     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   Sarcoidosis of lymph nodes 11/15/2018   Constipation 11/11/2018   Family history of colon cancer 11/11/2018   Hypercalcemia 11/09/2018   Splenomegaly 11/09/2018   Mass of left side of neck 07/22/2018   Cervical lymphadenopathy 07/22/2018   Gastroesophageal reflux disease with esophagitis  07/12/2018   Deviated nasal septum 05/30/2017   Environmental allergies 05/30/2017   Dysthymia 05/30/2017   Elevated blood pressure reading 05/30/2017   Alcohol induced fatty liver 05/22/2017   Thiamine deficiency 05/22/2017   Elevated ALT measurement 08/23/2016   Low libido 08/22/2016   Hypertriglyceridemia without hypercholesterolemia 04/29/2015   Right groin mass 04/28/2015   Generalized anxiety disorder 03/24/2015    Past Medical History:  Diagnosis Date   Alcohol induced fatty liver 05/22/2017   Anxiety    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   Hypertriglyceridemia without hypercholesterolemia 04/29/2015    Family History  Problem Relation Age of Onset   Breast cancer Mother    Depression Mother    Depression Father    Colon polyps Father    Colon cancer Paternal Grandfather    Past Surgical History:  Procedure Laterality Date   LYMPH NODE BIOPSY  2020   VASECTOMY     Social History   Social History Narrative   Not on file   Immunization History  Administered Date(s) Administered   Pneumococcal Polysaccharide-23 05/04/2020   Tdap 11/12/2020     Objective: Vital Signs: BP 133/87 (BP Location: Right Arm, Patient Position: Sitting, Cuff Size: Normal)   Pulse 71   Resp 16   Ht 6' 4.75" (1.949 m)   Wt 255 lb (115.7 kg)   BMI 30.44 kg/m    Physical Exam HENT:     Mouth/Throat:     Mouth: Mucous membranes are moist.     Pharynx: Oropharynx is clear.  Eyes:     Conjunctiva/sclera: Conjunctivae normal.  Neck:     Comments: Numerous large nontender lymph nodes throughout cervical region bilaterally, no palpable or tender axillary lymph nodes Cardiovascular:     Rate and Rhythm: Normal rate and regular rhythm.  Pulmonary:     Effort: Pulmonary effort is normal.     Breath sounds: Normal breath sounds.  Musculoskeletal:     Right lower leg: No edema.     Left lower leg: No edema.  Lymphadenopathy:     Cervical: Cervical adenopathy present.  Skin:     Findings: Rash present.     Comments: Rash extensively across entire back with slightly raised erythematous papules each 2 to 3 cm diameter no overlying scaling  Neurological:     General: No focal deficit present.     Mental Status: He is alert.      Musculoskeletal Exam:  Shoulders full ROM no tenderness or swelling Elbows full ROM no tenderness or swelling Wrists full ROM no tenderness or swelling Fingers full ROM no tenderness or swelling Knees full ROM no tenderness or swelling Ankles full ROM no tenderness or swelling   Investigation: No additional findings.  Imaging: No results found.  Recent Labs: Lab Results  Component Value Date   WBC 3.7 (L) 03/16/2022   HGB 15.5 03/16/2022   PLT 211 03/16/2022   NA 139 03/16/2022   K 4.3 03/16/2022   CL 103 03/16/2022   CO2 28 03/16/2022   GLUCOSE 128 (H) 03/16/2022   BUN 13 03/16/2022   CREATININE 1.07 03/16/2022   BILITOT 1.1 03/16/2022   ALKPHOS 81 08/22/2016   AST 43 (H) 03/16/2022  ALT 52 (H) 03/16/2022   PROT 7.4 03/16/2022   ALBUMIN 4.3 08/22/2016   ALBUMIN 4.3 08/22/2016   CALCIUM 9.9 03/16/2022   GFRAA 76 04/21/2020    Speciality Comments: No specialty comments available.  Procedures:  No procedures performed Allergies: Penicillins   Assessment / Plan:     Visit Diagnoses: Cutaneous sarcoidosis - Plan: Sedimentation rate, Angiotensin converting enzyme  Fairly widespread rash accompanying entire of his back at least 10% of total body surface area.  I agree he needs systemic treatment I do not think topical is feasible and reported limited success with topical steroids so far.  Also checking sedimentation rate and ACE level assessment for systemic disease activity.  I believe he will need to get back in with ophthalmology for updated exam concerning for left eye inflammation with persistent visual deficit.  Discussed treatment options I do not think he is a good candidate for methotrexate with his heavy  ongoing alcohol use and abnormal liver function with alcohol induced steatosis.  Plan to try starting azathioprine though discussed also shares risk for affecting his chronic mild lymphopenia.  Granulomatous lymphadenitis  Significant painless lymphadenopathy present on exam today concerning for continued systemic disease.  Will see how he responds to immunomodulatory treatment.  Alcohol induced fatty liver High risk medication use - Plan: Thiopurine methyltransferase(tpmt)rbc, CBC with Differential/Platelet, COMPLETE METABOLIC PANEL WITH GFR, QuantiFERON-TB Gold Plus  Recommending to try azathioprine due to lesser liver toxicity risk than methotrexate will check TPMT enzyme activity today.  Also checking CBC and CMP baseline for medication monitoring.  Checking QuantiFERON baseline screening in case biologic DMARD needed for tolerance or efficacy.  Hypercalcemia Vitamin D deficiency - Plan: Vitamin D 1,25 dihydroxy, VITAMIN D 25 Hydroxy (Vit-D Deficiency, Fractures)  Suspected diagnosis checking for vitamin D and activated vitamin D level associated with active sarcoidosis.  Previously with significant hypercalcemia but metabolic panel from December looked okay.  Orders: Orders Placed This Encounter  Procedures   Thiopurine methyltransferase(tpmt)rbc   Sedimentation rate   CBC with Differential/Platelet   COMPLETE METABOLIC PANEL WITH GFR   QuantiFERON-TB Gold Plus   Angiotensin converting enzyme   Vitamin D 1,25 dihydroxy   VITAMIN D 25 Hydroxy (Vit-D Deficiency, Fractures)   No orders of the defined types were placed in this encounter.    Follow-Up Instructions: Return in about 2 months (around 10/25/2022) for New pt sarcoidosis AZA start f/u 2mos.   Fuller Plan, MD  Note - This record has been created using [employer].  Chart creation errors have been sought, but may not always  have been located. Such creation errors do not reflect on  the standard of medical  care.

## 2022-08-26 LAB — COMPLETE METABOLIC PANEL WITH GFR
Albumin: 4.8 g/dL (ref 3.6–5.1)
BUN: 12 mg/dL (ref 7–25)
Globulin: 2.6 g/dL (calc) (ref 1.9–3.7)

## 2022-08-26 LAB — VITAMIN D 25 HYDROXY (VIT D DEFICIENCY, FRACTURES): Vit D, 25-Hydroxy: 17 ng/mL — ABNORMAL LOW (ref 30–100)

## 2022-08-27 LAB — COMPLETE METABOLIC PANEL WITH GFR
AG Ratio: 1.8 (calc) (ref 1.0–2.5)
Alkaline phosphatase (APISO): 72 U/L (ref 36–130)
Creat: 1.26 mg/dL (ref 0.60–1.29)
Glucose, Bld: 102 mg/dL — ABNORMAL HIGH (ref 65–99)
Total Bilirubin: 1.4 mg/dL — ABNORMAL HIGH (ref 0.2–1.2)

## 2022-08-27 LAB — QUANTIFERON-TB GOLD PLUS
Mitogen-NIL: 1.18 IU/mL
NIL: 0.09 IU/mL
TB1-NIL: 0.01 IU/mL

## 2022-08-27 LAB — ANGIOTENSIN CONVERTING ENZYME: Angiotensin-Converting Enzyme: 248 U/L — ABNORMAL HIGH (ref 9–67)

## 2022-08-29 LAB — VITAMIN D 1,25 DIHYDROXY: Vitamin D2 1, 25 (OH)2: <8 pg/mL

## 2022-08-29 LAB — COMPLETE METABOLIC PANEL WITH GFR
Chloride: 103 mmol/L (ref 98–110)
Sodium: 140 mmol/L (ref 135–146)

## 2022-08-29 LAB — QUANTIFERON-TB GOLD PLUS: TB2-NIL: 0 IU/mL

## 2022-09-06 LAB — CBC WITH DIFFERENTIAL/PLATELET
Absolute Monocytes: 480 cells/uL (ref 200–950)
Basophils Absolute: 62 cells/uL (ref 0–200)
Basophils Relative: 1.4 %
Eosinophils Absolute: 277 cells/uL (ref 15–500)
Eosinophils Relative: 6.3 %
HCT: 49.5 % (ref 38.5–50.0)
Hemoglobin: 16.9 g/dL (ref 13.2–17.1)
Lymphs Abs: 418 cells/uL — ABNORMAL LOW (ref 850–3900)
MCH: 31.9 pg (ref 27.0–33.0)
MCHC: 34.1 g/dL (ref 32.0–36.0)
MCV: 93.6 fL (ref 80.0–100.0)
MPV: 10.5 fL (ref 7.5–12.5)
Monocytes Relative: 10.9 %
Neutro Abs: 3164 cells/uL (ref 1500–7800)
Neutrophils Relative %: 71.9 %
Platelets: 197 10*3/uL (ref 140–400)
RBC: 5.29 10*6/uL (ref 4.20–5.80)
RDW: 13.6 % (ref 11.0–15.0)
Total Lymphocyte: 9.5 %
WBC: 4.4 10*3/uL (ref 3.8–10.8)

## 2022-09-06 LAB — COMPLETE METABOLIC PANEL WITH GFR
ALT: 51 U/L — ABNORMAL HIGH (ref 9–46)
AST: 37 U/L (ref 10–40)
CO2: 26 mmol/L (ref 20–32)
Calcium: 10.6 mg/dL — ABNORMAL HIGH (ref 8.6–10.3)
Potassium: 3.9 mmol/L (ref 3.5–5.3)
Total Protein: 7.4 g/dL (ref 6.1–8.1)
eGFR: 73 mL/min/{1.73_m2} (ref >=60)

## 2022-09-06 LAB — SEDIMENTATION RATE: Sed Rate: 2 mm/h (ref 0–15)

## 2022-09-06 LAB — THIOPURINE METHYLTRANSFERASE (TPMT), RBC: Thiopurine Methyltransferase, RBC: 16 nmol/hr/mL RBC

## 2022-09-06 LAB — VITAMIN D 1,25 DIHYDROXY
Vitamin D 1, 25 (OH)2 Total: 67 pg/mL (ref 18–72)
Vitamin D3 1, 25 (OH)2: 67 pg/mL

## 2022-09-06 LAB — QUANTIFERON-TB GOLD PLUS: QuantiFERON-TB Gold Plus: NEGATIVE

## 2022-09-06 MED ORDER — VITAMIN D 50 MCG (2000 UT) PO CAPS
2000.0000 [IU] | ORAL_CAPSULE | Freq: Every day | ORAL | Status: DC
Start: 2022-09-06 — End: 2023-02-28

## 2022-09-06 MED ORDER — AZATHIOPRINE 50 MG PO TABS
100.0000 mg | ORAL_TABLET | Freq: Every day | ORAL | 2 refills | Status: DC
Start: 2022-09-06 — End: 2022-12-09

## 2022-09-06 NOTE — Progress Notes (Signed)
ACE level is high at 248. This indicates active sarcoidosis. Calcium is high at 10.6 and vitamin D is low at 17 these are probably caused by the sarcoidosis.  His lymphocyte count is 418 this is low but not any worse compared to previous results throughout the past year. We will just have to monitor for it when starting new medication. The TPMT test is normal does not show any increased  Risk for taking azathioprine.   I recommend starting azathioprine 2 tablets daily. He should also start taking a vitamin D supplement 2000 units daily this can help decrease skin inflammation symptoms.

## 2022-09-06 NOTE — Addendum Note (Signed)
Addended by: Fuller Plan on: 09/06/2022 08:12 AM   Modules accepted: Orders

## 2022-09-09 ENCOUNTER — Other Ambulatory Visit: Payer: Self-pay | Admitting: Family Medicine

## 2022-09-11 ENCOUNTER — Other Ambulatory Visit: Payer: Self-pay | Admitting: Family Medicine

## 2022-09-11 DIAGNOSIS — F411 Generalized anxiety disorder: Secondary | ICD-10-CM

## 2022-11-01 ENCOUNTER — Ambulatory Visit: Payer: BC Managed Care – PPO | Admitting: Internal Medicine

## 2022-12-07 ENCOUNTER — Encounter: Payer: Self-pay | Admitting: Internal Medicine

## 2022-12-07 ENCOUNTER — Ambulatory Visit: Payer: BC Managed Care – PPO | Attending: Internal Medicine | Admitting: Internal Medicine

## 2022-12-07 VITALS — BP 131/80 | HR 63 | Resp 17 | Ht 76.0 in | Wt 256.8 lb

## 2022-12-07 DIAGNOSIS — D861 Sarcoidosis of lymph nodes: Secondary | ICD-10-CM

## 2022-12-07 DIAGNOSIS — D863 Sarcoidosis of skin: Secondary | ICD-10-CM | POA: Diagnosis not present

## 2022-12-07 DIAGNOSIS — Z79899 Other long term (current) drug therapy: Secondary | ICD-10-CM

## 2022-12-07 NOTE — Progress Notes (Unsigned)
Office Visit Note  Patient: Eduardo Parker             Date of Birth: 1980/11/12           MRN: [id number]             PCP: Everrett Coombe, DO Referring: Everrett Coombe, DO Visit Date: 12/07/2022   Subjective:  Other (Patient reports that symptoms are stable, no change since May. )   History of Present Illness: Eduardo Parker is a 42 y.o. male here for follow up ***   Previous HPI 08/25/22 [name] is a 43 y.o. male here for sarcoidosis.  He was originally diagnosed with sarcoidosis after hospitalization for acute kidney injury with hypercalcemia and diffuse lymphadenopathy with granulomatous lymphadenitis findings on biopsy in July 2020.  Was also found to have some papular dermatitis involvement as well as left I inflammation with decreased visual acuity.  He was treated with a course of oral steroids with improvement of his lymphadenopathy symptoms. He saw Dr. Sherryll Burger with findings indicative for inflammatory eye disease and was started on treatment with methotrexate but looks like he did not stay on the medication longer than 2 months.  He reports seeing no appreciable difference in symptoms when taking the medication.  He is pretty much had persistent painless lymph node swelling in multiple areas and decreased visual acuity in the left eye.  In the past few months skin rash has become more extensive now covering most of his back he was prescribed topical steroids for use but does not find is feasible over the large surface area have not seen a great response with the use so far.  He reports never having good exercise tolerance going back years but no particular cough or worsened dyspnea on exertion.  No joint pain or swelling.  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     HBV/HCV neg 04/2019   Review of Systems  Constitutional:  Positive for fatigue.  HENT:  Negative for mouth sores and mouth  dryness.   Eyes:  Negative for dryness.  Respiratory:  Negative for shortness of breath.   Cardiovascular:  Negative for chest pain and palpitations.  Gastrointestinal:  Negative for blood in stool, constipation and diarrhea.  Endocrine: Positive for increased urination.  Genitourinary:  Negative for painful urination and involuntary urination.  Musculoskeletal:  Negative for joint pain, gait problem, joint pain, joint swelling, myalgias, muscle weakness, morning stiffness, muscle tenderness and myalgias.  Skin:  Positive for rash and sensitivity to sunlight. Negative for color change and hair loss.  Allergic/Immunologic: Negative for susceptible to infections.  Neurological:  Negative for dizziness and headaches.  Hematological:  Positive for swollen glands.  Psychiatric/Behavioral:  Positive for depressed mood and sleep disturbance. The patient is not nervous/anxious.     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   Sarcoidosis of lymph nodes 11/15/2018   Constipation 11/11/2018   Family history of colon cancer 11/11/2018   Hypercalcemia 11/09/2018   Splenomegaly 11/09/2018   Mass of left side of neck  07/22/2018   Cervical lymphadenopathy 07/22/2018   Gastroesophageal reflux disease with esophagitis 07/12/2018   Deviated nasal septum 05/30/2017   Environmental allergies 05/30/2017   Dysthymia 05/30/2017   Elevated blood pressure reading 05/30/2017   Alcohol induced fatty liver 05/22/2017   Thiamine deficiency 05/22/2017   Elevated ALT measurement 08/23/2016   Low libido 08/22/2016    Hypertriglyceridemia without hypercholesterolemia 04/29/2015   Right groin mass 04/28/2015   Generalized anxiety disorder 03/24/2015    Past Medical History:  Diagnosis Date   Alcohol induced fatty liver 05/22/2017   Anxiety    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   Hypertriglyceridemia without hypercholesterolemia 04/29/2015    Family History  Problem Relation Age of Onset   Breast cancer Mother    Depression Mother    Depression Father    Bipolar disorder Father    Colon cancer Paternal Grandfather    Past Surgical History:  Procedure Laterality Date   LYMPH NODE BIOPSY  2020   VASECTOMY     Social History   Social History Narrative   Not on file   Immunization History  Administered Date(s) Administered   Pneumococcal Polysaccharide-23 05/04/2020   Tdap 11/12/2020     Objective: Vital Signs: BP (!) 145/88 (BP Location: Left Arm, Patient Position: Sitting, Cuff Size: Large)   Pulse 63   Resp 17   Ht 6\' 4"  (1.93 m)   Wt 256 lb 12.8 oz (116.5 kg)   BMI 31.26 kg/m    Physical Exam   Musculoskeletal Exam: ***  CDAI Exam: CDAI Score: -- Patient Global: --; Provider Global: -- Swollen: --; Tender: -- Joint Exam 12/07/2022   No joint exam has been documented for this visit   There is currently no information documented on the homunculus. Go to the Rheumatology activity and complete the homunculus joint exam.  Investigation: No additional findings.  Imaging: No results found.  Recent Labs: Lab Results  Component Value Date   WBC 4.4 08/25/2022   HGB 16.9 08/25/2022   PLT 197 08/25/2022   NA 140 08/25/2022   K 3.9 08/25/2022   CL 103 08/25/2022   CO2 26 08/25/2022   GLUCOSE 102 (H) 08/25/2022   BUN 12 08/25/2022   CREATININE 1.26 08/25/2022   BILITOT 1.4 (H) 08/25/2022   ALKPHOS 81 08/22/2016   AST 37 08/25/2022   ALT 51 (H) 08/25/2022   PROT 7.4 08/25/2022   ALBUMIN 4.3 08/22/2016   ALBUMIN 4.3 08/22/2016   CALCIUM 10.6 (H) 08/25/2022    GFRAA 76 04/21/2020   QFTBGOLDPLUS NEGATIVE 08/25/2022    Speciality Comments: No specialty comments available.  Procedures:  No procedures performed Allergies: Penicillins   Assessment / Plan:     Visit Diagnoses: Cutaneous sarcoidosis  ***  Orders: No orders of the defined types were placed in this encounter.  No orders of the defined types were placed in this encounter.    Follow-Up Instructions: No follow-ups on file.   Fuller Plan, MD  Note - This record has been created using [employer].  Chart creation errors have been sought, but may not always  have been located. Such creation errors do not reflect on  the standard of medical care.

## 2022-12-08 LAB — COMPLETE METABOLIC PANEL WITH GFR
AG Ratio: 2.4 (calc) (ref 1.0–2.5)
ALT: 25 U/L (ref 9–46)
AST: 26 U/L (ref 10–40)
Albumin: 5.2 g/dL — ABNORMAL HIGH (ref 3.6–5.1)
Alkaline phosphatase (APISO): 50 U/L (ref 36–130)
BUN: 15 mg/dL (ref 7–25)
CO2: 26 mmol/L (ref 20–32)
Calcium: 11.8 mg/dL — ABNORMAL HIGH (ref 8.6–10.3)
Chloride: 103 mmol/L (ref 98–110)
Creat: 1.29 mg/dL (ref 0.60–1.29)
Globulin: 2.2 g/dL (ref 1.9–3.7)
Glucose, Bld: 94 mg/dL (ref 65–99)
Potassium: 3.7 mmol/L (ref 3.5–5.3)
Sodium: 139 mmol/L (ref 135–146)
Total Bilirubin: 1.7 mg/dL — ABNORMAL HIGH (ref 0.2–1.2)
Total Protein: 7.4 g/dL (ref 6.1–8.1)
eGFR: 70 mL/min/{1.73_m2} (ref >=60)

## 2022-12-08 LAB — CBC WITH DIFFERENTIAL/PLATELET
Absolute Monocytes: 428 {cells}/uL (ref 200–950)
Basophils Absolute: 61 {cells}/uL (ref 0–200)
Basophils Relative: 1.3 %
Eosinophils Absolute: 338 {cells}/uL (ref 15–500)
Eosinophils Relative: 7.2 %
HCT: 41.2 % (ref 38.5–50.0)
Hemoglobin: 14.6 g/dL (ref 13.2–17.1)
Lymphs Abs: 348 {cells}/uL — ABNORMAL LOW (ref 850–3900)
MCH: 34 pg — ABNORMAL HIGH (ref 27.0–33.0)
MCHC: 35.4 g/dL (ref 32.0–36.0)
MCV: 95.8 fL (ref 80.0–100.0)
MPV: 10.6 fL (ref 7.5–12.5)
Monocytes Relative: 9.1 %
Neutro Abs: 3525 {cells}/uL (ref 1500–7800)
Neutrophils Relative %: 75 %
Platelets: 214 10*3/uL (ref 140–400)
RBC: 4.3 10*6/uL (ref 4.20–5.80)
RDW: 14.5 % (ref 11.0–15.0)
Total Lymphocyte: 7.4 %
WBC: 4.7 10*3/uL (ref 3.8–10.8)

## 2022-12-08 LAB — ANGIOTENSIN CONVERTING ENZYME: Angiotensin-Converting Enzyme: 213 U/L — ABNORMAL HIGH (ref 9–67)

## 2022-12-09 MED ORDER — MYCOPHENOLATE MOFETIL 500 MG PO TABS: 500.0000 mg | ORAL_TABLET | Freq: Two times a day (BID) | ORAL | 1 refills | Status: DC

## 2022-12-09 NOTE — Progress Notes (Signed)
ACE level decreased to 213 from 248. But calcium level increased to 11.8 from 10.6. With somewhat mixed and mild results I don't recommend continuing the imuran. We can try switching this to CellCept like discussed in clinic. Starting dose would be 500 mg twice daily.

## 2022-12-11 ENCOUNTER — Other Ambulatory Visit: Payer: Self-pay | Admitting: Family Medicine

## 2022-12-11 DIAGNOSIS — F411 Generalized anxiety disorder: Secondary | ICD-10-CM

## 2022-12-16 ENCOUNTER — Ambulatory Visit: Payer: BC Managed Care – PPO | Admitting: Family Medicine

## 2022-12-16 ENCOUNTER — Encounter: Payer: Self-pay | Admitting: Family Medicine

## 2022-12-16 VITALS — BP 134/76 | HR 71 | Ht 76.0 in | Wt 259.8 lb

## 2022-12-16 DIAGNOSIS — F411 Generalized anxiety disorder: Secondary | ICD-10-CM

## 2022-12-16 DIAGNOSIS — D861 Sarcoidosis of lymph nodes: Secondary | ICD-10-CM | POA: Diagnosis not present

## 2022-12-16 DIAGNOSIS — F341 Dysthymic disorder: Secondary | ICD-10-CM

## 2022-12-16 DIAGNOSIS — E1165 Type 2 diabetes mellitus with hyperglycemia: Secondary | ICD-10-CM | POA: Diagnosis not present

## 2022-12-16 DIAGNOSIS — I10 Essential (primary) hypertension: Secondary | ICD-10-CM | POA: Diagnosis not present

## 2022-12-16 LAB — POCT GLYCOSYLATED HEMOGLOBIN (HGB A1C): HbA1c POC (<> result, manual entry): 4.5 % (ref 4.0–5.6)

## 2022-12-16 MED ORDER — VENLAFAXINE HCL ER 150 MG PO CP24
150.0000 mg | ORAL_CAPSULE | Freq: Every day | ORAL | 2 refills | Status: DC
Start: 2022-12-16 — End: 2023-10-13

## 2022-12-16 NOTE — Assessment & Plan Note (Signed)
Increase in effexor seems to be effective.  Recommend continuation at current strength.

## 2022-12-16 NOTE — Assessment & Plan Note (Addendum)
Diabetes is well controlled.  Has been off of ozempic for a few months.  Encouraged continued dietary changes.  Removing diabetes from his active problem list.

## 2022-12-16 NOTE — Assessment & Plan Note (Signed)
Management per rheumatology, recently started on cellcept

## 2022-12-16 NOTE — Progress Notes (Signed)
Eduardo Parker - 42 y.o. male MRN [id number]  Date of birth: 05-05-1980  Subjective Chief Complaint  Patient presents with   Diabetes    HPI Eduardo Parker is a 42 y.o. male here today for follow up visit.   He reports that he is doing well.   He has been off of Ozempic for a few months.  He has continued to work on diet and lifestyle changes. No side effects with Ozempic  previously, cost was too much.   Excellent control of glucose on previous check a few months ago.  Tolerating crestor well for associated HLD.   Doing well with Effexor for management of of depression.  No side effects noted at this time.   Seeing rheumatology for management of sarcoidosis.  Recently changed to cellcept from imuran.    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   Hypertriglyceridemia without hypercholesterolemia 04/29/2015   Type 2 diabetes mellitus with hyperglycemia, without long-term current use of insulin (HCC) 04/22/2020    Past Surgical History:  Procedure Laterality Date   LYMPH NODE BIOPSY  2020   VASECTOMY      Social History   Socioeconomic History   Marital status: Single    Spouse name: Not on file   Number of children: Not on file   Years of education: Not on file   Highest education level: Not on file  Occupational History   Not on file  Tobacco Use   Smoking status: Never    Passive exposure: Past   Smokeless tobacco: Never  Vaping Use   Vaping status: Never Used  Substance and Sexual Activity   Alcohol use: Yes    Alcohol/week: 50.0 standard drinks of alcohol    Types: 50 Standard drinks or equivalent per week    Comment: 8 drinks daily   Drug use: No   Sexual activity: Yes    Birth control/protection: None  Other Topics  Concern   Not on file  Social History Narrative   Not on file   Social Determinants of Health   Financial Resource Strain: Not on file  Food Insecurity: Not on file  Transportation Needs: Not on file  Physical Activity: Not on file  Stress: Not on file  Social Connections: Unknown (08/24/2021)   Received from [facility]   Social Network    Social Network: Not on file    Family History  Problem Relation Age of Onset   Breast cancer Mother    Depression Mother    Depression Father    Bipolar disorder Father    Colon cancer Paternal Grandfather     Health Maintenance  Topic Date Due   OPHTHALMOLOGY EXAM  05/27/2021   INFLUENZA VACCINE  11/24/2022   COVID-19 Vaccine (1) 07/03/2023 (Originally 11/13/1985)   Diabetic kidney evaluation - Urine ACR  03/17/2023   HEMOGLOBIN A1C  06/18/2023   Diabetic kidney evaluation - eGFR measurement  12/07/2023   FOOT EXAM  12/16/2023   DTaP/Tdap/Td (2 - Td or Tdap) 11/13/2030   Hepatitis C Screening  Completed   HIV Screening  Completed   HPV VACCINES  Aged Out     ----------------------------------------------------------------------------------------------------------------------------------------------------------------------------------------------------------------- Physical Exam BP 134/76   Pulse 71  Ht 6\' 4"  (1.93 m)   Wt 259 lb 12 oz (117.8 kg)   SpO2 99%   BMI 31.62 kg/m   Physical Exam Constitutional:      Appearance: Normal appearance.  Eyes:     General: No scleral icterus. Cardiovascular:     Rate and Rhythm: Normal rate and regular rhythm.  Pulmonary:     Effort: Pulmonary effort is normal.     Breath sounds: Normal breath sounds.  Musculoskeletal:     Cervical back: Neck supple.  Neurological:     Mental Status: He is alert.  Psychiatric:        Mood and Affect: Mood normal.        Behavior: Behavior normal.      ------------------------------------------------------------------------------------------------------------------------------------------------------------------------------------------------------------------- Assessment and Plan  Sarcoidosis of lymph nodes Management per rheumatology, recently started on cellcept  Essential hypertension BP remains well controlled off medication.   Type 2 diabetes mellitus with hyperglycemia, without long-term current use of insulin (HCC) Diabetes is well controlled.  Has been off of ozempic for a few months.  Encouraged continued dietary changes.  Removing diabetes from his active problem list.    Dysthymia Increase in effexor seems to be effective.  Recommend continuation at current strength.    Meds ordered this encounter  Medications   venlafaxine XR (EFFEXOR-XR) 150 MG 24 hr capsule    Sig: Take 1 capsule (150 mg total) by mouth daily with breakfast.    Dispense:  90 capsule    Refill:  2    Return in about 6 months (around 06/18/2023) for BP/T2DM.    This visit occurred during the SARS-CoV-2 public health emergency.  Safety protocols were in place, including screening questions prior to the visit, additional usage of staff PPE, and extensive cleaning of exam room while observing appropriate contact time as indicated for disinfecting solutions.

## 2022-12-16 NOTE — Assessment & Plan Note (Signed)
BP remains well controlled off medication.

## 2023-01-24 NOTE — Progress Notes (Signed)
Office Visit Note  Patient: Eduardo Parker             Date of Birth: [birth year]           MRN: [id number]             PCP: Everrett Coombe, DO Referring: Everrett Coombe, DO Visit Date: 02/07/2023   Subjective:  Follow-up (Patient states for about a week or so he has had soreness in his neck. )   History of Present Illness: Eduardo Parker is a 42 y.o. 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  He started taking the mycophenolate 500 mg twice daily for about 2 months so far has not seen any major difference in symptoms also no trouble taking the medicine.  Still with active rashes throughout his whole back.  No new swollen joints has been suffering some pain and soreness in his neck for at least the past 1 week.  He is not sure if it is related to the large lymph nodes. He is also noticed a new small erythematous rash on the edge of his glans.  No other lesions in his genital area and no palpable adenopathy.  Not associated with any dysuria, frequency, hematuria, or discharge.  Previous HPI 12/07/2022 Eduardo Parker is a 42 y.o. 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  He started on Imuran has been taking 100 mg daily now for about 2 months.  So far has not seen any significant improvement in symptoms.  Not noticed any side effect or problem with the medication either.  Still has extensive rashes throughout his back and palpable nontender nodules around the neck.   Previous HPI 08/25/22 Eduardo Parker is a 42 y.o. male here for sarcoidosis.  He was originally diagnosed with sarcoidosis after hospitalization for acute kidney injury with hypercalcemia and diffuse lymphadenopathy with granulomatous lymphadenitis findings on biopsy in July 2020.  Was also found to have some papular dermatitis involvement as well as left I inflammation with decreased  visual acuity.  He was treated with a course of oral steroids with improvement of his lymphadenopathy symptoms. He saw Dr. Sherryll Burger with findings indicative for inflammatory eye disease and was started on treatment with methotrexate but looks like he did not stay on the medication longer than 2 months.  He reports seeing no appreciable difference in symptoms when taking the medication.  He is pretty much had persistent painless lymph node swelling in multiple areas and decreased visual acuity in the left eye.  In the past few months skin rash has become more extensive now covering most of his back he was prescribed topical steroids for use but does not find is feasible over the large surface area have not seen a great response with the use so far.  He reports never having good exercise tolerance going back years but no particular cough or worsened dyspnea on exertion.  No joint pain or swelling.  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   HBV/HCV neg 04/2019   Review of Systems  Constitutional:  Negative for fatigue.  HENT:  Negative for mouth sores and mouth dryness.   Eyes:  Negative for dryness.  Respiratory:  Negative for shortness of breath.   Cardiovascular:  Negative for chest pain and palpitations.  Gastrointestinal:  Negative for  blood in stool, constipation and diarrhea.  Endocrine: Negative for increased urination.  Genitourinary:  Negative for involuntary urination.  Musculoskeletal:  Negative for joint pain, gait problem, joint pain, joint swelling, myalgias, muscle weakness, morning stiffness, muscle tenderness and myalgias.  Skin:  Negative for color change, rash, hair loss and sensitivity to sunlight.  Allergic/Immunologic: Negative for susceptible to infections.  Neurological:  Negative for dizziness and headaches.  Hematological:  Positive for swollen glands.  Psychiatric/Behavioral:  Positive for  depressed mood. Negative for sleep disturbance. The patient is nervous/anxious.     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   Sarcoidosis of lymph nodes 11/15/2018   Constipation 11/11/2018   Family history of colon cancer 11/11/2018   Hypercalcemia 11/09/2018   Splenomegaly 11/09/2018   Mass of left side of neck 07/22/2018   Cervical lymphadenopathy 07/22/2018   Gastroesophageal reflux disease with esophagitis 07/12/2018   Deviated nasal septum 05/30/2017   Environmental allergies 05/30/2017   Dysthymia 05/30/2017   Elevated blood pressure reading 05/30/2017   Alcohol induced fatty liver 05/22/2017   Thiamine deficiency 05/22/2017   Elevated ALT measurement 08/23/2016   Low libido 08/22/2016   Hypertriglyceridemia without hypercholesterolemia 04/29/2015   Right groin mass 04/28/2015   Generalized anxiety disorder 03/24/2015    Past Medical History:  Diagnosis Date   Alcohol induced fatty liver 05/22/2017   Anxiety    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   Hypertriglyceridemia without hypercholesterolemia 04/29/2015   Type 2 diabetes mellitus with hyperglycemia, without long-term current use of insulin (HCC) 04/22/2020    Family History  Problem Relation Age of Onset   Breast cancer Mother    Depression Mother    Depression Father    Bipolar disorder Father    Colon cancer Paternal Grandfather    Past Surgical History:  Procedure Laterality Date   LYMPH NODE BIOPSY  2020   VASECTOMY     Social History   Social History Narrative   Not on file   Immunization History  Administered Date(s) Administered    Pneumococcal Polysaccharide-23 05/04/2020   Tdap 11/12/2020     Objective: Vital Signs: BP (!) 148/87 (BP Location: Left Arm, Patient Position: Sitting, Cuff Size: Normal)   Pulse 67   Resp 12   Ht 6\' 4"  (1.93 m)   Wt 261 lb (118.4 kg)   BMI 31.77 kg/m    Physical Exam Eyes:     Conjunctiva/sclera: Conjunctivae normal.  Cardiovascular:     Rate and Rhythm: Normal rate and regular rhythm.  Pulmonary:     Effort: Pulmonary effort is normal.     Breath sounds: Normal breath sounds.  Lymphadenopathy:     Cervical: Cervical adenopathy (Multiple nontender palpable cervical lymph nodes) present.  Skin:    General: Skin is warm and dry.     Findings: Rash present.     Comments: Rash extensively across entire back with slightly raised erythematous papules each 2 to 3 cm diameter no overlying scaling Approximately 5 mm diameter small erythematous lesion on the top margin of the glans, no ulceration or scabbing and no induration  Neurological:     Mental Status: He is alert.  Psychiatric:  Mood and Affect: Mood normal.      Musculoskeletal Exam:  Shoulders full ROM no tenderness or swelling Elbows full ROM no tenderness or swelling Wrists full ROM no tenderness or swelling Fingers full ROM no tenderness or swelling Knees full ROM no tenderness or swelling   Investigation: No additional findings.  Imaging: No results found.  Recent Labs: Lab Results  Component Value Date   WBC 4.1 02/07/2023   HGB 15.2 02/07/2023   PLT 256 02/07/2023   NA 139 02/07/2023   K 3.8 02/07/2023   CL 102 02/07/2023   CO2 26 02/07/2023   GLUCOSE 86 02/07/2023   BUN 19 02/07/2023   CREATININE 1.28 02/07/2023   BILITOT 1.2 02/07/2023   ALKPHOS 81 08/22/2016   AST 33 02/07/2023   ALT 52 (H) 02/07/2023   PROT 7.2 02/07/2023   ALBUMIN 4.3 08/22/2016   ALBUMIN 4.3 08/22/2016   CALCIUM 11.0 (H) 02/07/2023   GFRAA 76 04/21/2020   QFTBGOLDPLUS NEGATIVE 08/25/2022    Speciality  Comments: No specialty comments available.  Procedures:  No procedures performed Allergies: Penicillins   Assessment / Plan:     Visit Diagnoses: Sarcoidosis of lymph nodes - Plan: Angiotensin converting enzyme  No appreciable change in cervical lymph node or skin rashes so far.  Will recheck ACE level see if there is a measurable decrease in response to treatment so far.  If findings unchanged will recommend titration of mycophenolate to 1000 mg twice daily.   High risk medication use -Cellcept 500 mg two times daily. - Plan: CBC with Differential/Platelet, COMPLETE METABOLIC PANEL WITH GFR  Checking CBC and CMP for medication monitoring on CellCept.  No particular GI toxicity reported.  No serious interval infections.  Rash of penis  Rash looks most consistent with small eczema or dryness and friction related rash versus any infectious or inflammation source.  Recommended use of topical product such as petrolatum or other barrier ointment to affected area and monitoring.  Orders: Orders Placed This Encounter  Procedures   CBC with Differential/Platelet   COMPLETE METABOLIC PANEL WITH GFR   Angiotensin converting enzyme   No orders of the defined types were placed in this encounter.    Follow-Up Instructions: Return in about 2 months (around 04/09/2023) for Sarcoidosis MM titration f/u 2mos.   Fuller Plan, MD  Note - This record has been created using [employer].  Chart creation errors have been sought, but may not always  have been located. Such creation errors do not reflect on  the standard of medical care.

## 2023-02-07 ENCOUNTER — Encounter: Payer: Self-pay | Admitting: Internal Medicine

## 2023-02-07 ENCOUNTER — Ambulatory Visit: Payer: BC Managed Care – PPO | Attending: Internal Medicine | Admitting: Internal Medicine

## 2023-02-07 VITALS — BP 148/87 | HR 67 | Resp 12 | Ht 76.0 in | Wt 261.0 lb

## 2023-02-07 DIAGNOSIS — D863 Sarcoidosis of skin: Secondary | ICD-10-CM

## 2023-02-07 DIAGNOSIS — R21 Rash and other nonspecific skin eruption: Secondary | ICD-10-CM

## 2023-02-07 DIAGNOSIS — Z79899 Other long term (current) drug therapy: Secondary | ICD-10-CM

## 2023-02-07 DIAGNOSIS — D861 Sarcoidosis of lymph nodes: Secondary | ICD-10-CM

## 2023-02-10 ENCOUNTER — Other Ambulatory Visit: Payer: Self-pay | Admitting: Internal Medicine

## 2023-02-10 DIAGNOSIS — D861 Sarcoidosis of lymph nodes: Secondary | ICD-10-CM

## 2023-02-10 DIAGNOSIS — D863 Sarcoidosis of skin: Secondary | ICD-10-CM

## 2023-02-10 LAB — COMPLETE METABOLIC PANEL WITH GFR
AG Ratio: 1.9 (calc) (ref 1.0–2.5)
ALT: 52 U/L — ABNORMAL HIGH (ref 9–46)
AST: 33 U/L (ref 10–40)
Albumin: 4.7 g/dL (ref 3.6–5.1)
Alkaline phosphatase (APISO): 68 U/L (ref 36–130)
BUN: 19 mg/dL (ref 7–25)
CO2: 26 mmol/L (ref 20–32)
Calcium: 11 mg/dL — ABNORMAL HIGH (ref 8.6–10.3)
Chloride: 102 mmol/L (ref 98–110)
Creat: 1.28 mg/dL (ref 0.60–1.29)
Globulin: 2.5 g/dL (ref 1.9–3.7)
Glucose, Bld: 86 mg/dL (ref 65–99)
Potassium: 3.8 mmol/L (ref 3.5–5.3)
Sodium: 139 mmol/L (ref 135–146)
Total Bilirubin: 1.2 mg/dL (ref 0.2–1.2)
Total Protein: 7.2 g/dL (ref 6.1–8.1)
eGFR: 71 mL/min/{1.73_m2} (ref >=60)

## 2023-02-10 LAB — CBC WITH DIFFERENTIAL/PLATELET
Absolute Lymphocytes: 353 {cells}/uL — ABNORMAL LOW (ref 850–3900)
Absolute Monocytes: 517 {cells}/uL (ref 200–950)
Basophils Absolute: 49 {cells}/uL (ref 0–200)
Basophils Relative: 1.2 %
Eosinophils Absolute: 213 {cells}/uL (ref 15–500)
Eosinophils Relative: 5.2 %
HCT: 44.1 % (ref 38.5–50.0)
Hemoglobin: 15.2 g/dL (ref 13.2–17.1)
MCH: 33.8 pg — ABNORMAL HIGH (ref 27.0–33.0)
MCHC: 34.5 g/dL (ref 32.0–36.0)
MCV: 98 fL (ref 80.0–100.0)
MPV: 10.3 fL (ref 7.5–12.5)
Monocytes Relative: 12.6 %
Neutro Abs: 2968 {cells}/uL (ref 1500–7800)
Neutrophils Relative %: 72.4 %
Platelets: 256 10*3/uL (ref 140–400)
RBC: 4.5 10*6/uL (ref 4.20–5.80)
RDW: 12.6 % (ref 11.0–15.0)
Total Lymphocyte: 8.6 %
WBC: 4.1 10*3/uL (ref 3.8–10.8)

## 2023-02-10 LAB — ANGIOTENSIN CONVERTING ENZYME: Angiotensin-Converting Enzyme: 210 U/L — ABNORMAL HIGH (ref 9–67)

## 2023-02-10 NOTE — Telephone Encounter (Signed)
Last Fill: 12/09/2022  Labs: 02/07/2023 Calcium 11.0 ALT 52 MCH 33.8 Absolute Lymphocytes 353  Next Visit: 04/10/2023  Last Visit: 02/07/2023  DX: Sarcoidosis of lymph nodes   Current Dose per office note 02/07/2023: not mentioned  Okay to refill Cellcept?

## 2023-02-13 NOTE — Progress Notes (Signed)
ACE level was 210 about the same as 213 last time this is still pretty elevated. His ALT is 52 this is a liver enzyme slightly above normal but not different from his previous results. I recommend he can try increasing the cellcept to 2 tablets twice daily. If we don't see more improvement with that we can change to a more effective type of medicine.

## 2023-02-28 ENCOUNTER — Encounter: Payer: Self-pay | Admitting: Family Medicine

## 2023-02-28 ENCOUNTER — Ambulatory Visit: Payer: BC Managed Care – PPO | Admitting: Family Medicine

## 2023-02-28 VITALS — BP 121/77 | HR 81 | Ht 76.0 in | Wt 264.0 lb

## 2023-02-28 DIAGNOSIS — Z113 Encounter for screening for infections with a predominantly sexual mode of transmission: Secondary | ICD-10-CM | POA: Diagnosis not present

## 2023-02-28 DIAGNOSIS — N489 Disorder of penis, unspecified: Secondary | ICD-10-CM | POA: Insufficient documentation

## 2023-02-28 MED ORDER — CLOTRIMAZOLE-BETAMETHASONE 1-0.05 % EX CREA: 1.0000 | TOPICAL_CREAM | Freq: Every day | CUTANEOUS | 0 refills | Status: DC

## 2023-02-28 NOTE — Assessment & Plan Note (Signed)
Does not appear herpetic in nature. ? Manifestation of sarcoid.vs fungal rash with history of diabetes and immunosuppression.  Trial of lotrisone cream to area.  He will let me know if not improving.

## 2023-02-28 NOTE — Progress Notes (Signed)
Eduardo Parker - 42 y.o. male MRN [id number]  Date of birth: 06-14-1980  Subjective Chief Complaint  Patient presents with   Exposure to STD    HPI Eduardo Parker is a 42 y.o. male here today with concern about STI.  He has lesion on the penis that has been present for a few weeks.  Area comes and goes.  Denies pain or irritation with this.  He has not had pain with urination, testicular pain or lymph node enlargement.  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   Hypertriglyceridemia without hypercholesterolemia 04/29/2015   Type 2 diabetes mellitus with hyperglycemia, without long-term current use of insulin (HCC) 04/22/2020    Past Surgical History:  Procedure Laterality Date   LYMPH NODE BIOPSY  2020   VASECTOMY      Social History   Socioeconomic History   Marital status: Single    Spouse name: Not on file   Number of children: Not on file   Years of education: Not on file   Highest education level: Not on file  Occupational History   Not on file  Tobacco Use   Smoking status: Never    Passive exposure: Past   Smokeless tobacco: Never  Vaping Use   Vaping status: Never Used  Substance and Sexual Activity   Alcohol use: Yes    Alcohol/week: 50.0 standard drinks of alcohol    Types: 50 Standard drinks or equivalent per week    Comment: 8 drinks daily   Drug use: No   Sexual activity: Yes    Birth control/protection: None  Other Topics Concern   Not on file  Social History Narrative   Not on file   Social Determinants of Health   Financial Resource Strain: Not on file  Food Insecurity: Not on file  Transportation Needs: Not on file  Physical Activity: Not on file  Stress: Not on  file  Social Connections: Unknown (08/24/2021)   Received from [facility]   Social Network    Social Network: Not on file    Family History  Problem Relation Age of Onset   Breast cancer Mother    Depression Mother    Depression Father    Bipolar disorder Father    Colon cancer Paternal Grandfather     Health Maintenance  Topic Date Due   Diabetic kidney evaluation - Urine ACR  03/17/2023   OPHTHALMOLOGY EXAM  02/28/2023 (Originally 05/27/2021)   COVID-19 Vaccine (1) 07/03/2023 (Originally 11/13/1985)   INFLUENZA VACCINE  07/24/2023 (Originally 11/24/2022)   HEMOGLOBIN A1C  06/18/2023   FOOT EXAM  12/16/2023   Diabetic kidney evaluation - eGFR measurement  02/07/2024   DTaP/Tdap/Td (2 - Td or Tdap) 11/13/2030   Hepatitis C Screening  Completed   HIV Screening  Completed   HPV VACCINES  Aged Out     ----------------------------------------------------------------------------------------------------------------------------------------------------------------------------------------------------------------- Physical Exam BP 121/77   Pulse 81   Ht 6\' 4"  (1.93 m)   Wt 264 lb (119.7 kg)   SpO2 99%   BMI 32.14 kg/m   Physical Exam Constitutional:      Appearance: Normal appearance.  Genitourinary:    Comments: Slightly erythematous lesion on the  top of the glans.  Lesion without tenderness.  Does not appear vesicular in nature.  Neurological:     Mental Status: He is alert.  Psychiatric:        Mood and Affect: Mood normal.        Behavior: Behavior normal.     ------------------------------------------------------------------------------------------------------------------------------------------------------------------------------------------------------------------- Assessment and Plan  Penile lesion Does not appear herpetic in nature. ? Manifestation of sarcoid.vs fungal rash with history of diabetes and immunosuppression.  Trial of lotrisone cream  to area.  He will let me know if not improving.    Meds ordered this encounter  Medications   clotrimazole-betamethasone (LOTRISONE) cream    Sig: Apply 1 Application topically daily.    Dispense:  30 g    Refill:  0    No follow-ups on file.    This visit occurred during the SARS-CoV-2 public health emergency.  Safety protocols were in place, including screening questions prior to the visit, additional usage of staff PPE, and extensive cleaning of exam room while observing appropriate contact time as indicated for disinfecting solutions.

## 2023-02-28 NOTE — Patient Instructions (Signed)
Try lotrisone cream twice daily.  Let me know if not improving with this.

## 2023-03-01 LAB — RPR: RPR Ser Ql: NONREACTIVE

## 2023-03-01 LAB — HEPATITIS C ANTIBODY: Hep C Virus Ab: NONREACTIVE

## 2023-03-01 LAB — HIV ANTIBODY (ROUTINE TESTING W REFLEX): HIV Screen 4th Generation wRfx: NONREACTIVE

## 2023-03-03 LAB — CHLAMYDIA/GONOCOCCUS/TRICHOMONAS, NAA
Chlamydia by NAA: NEGATIVE
Gonococcus by NAA: NEGATIVE
Trich vag by NAA: NEGATIVE

## 2023-03-29 NOTE — Progress Notes (Signed)
Office Visit Note  Patient: Eduardo Parker             Date of Birth: 03/31/1981           MRN: [id number]             PCP: Everrett Coombe, DO Referring: Everrett Coombe, DO Visit Date: 04/10/2023   Subjective:  Follow-up   History of Present Illness: Eduardo Parker is a 42 y.o. 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  He tried increasing mycophenolate to 1000 mg twice daily but despite taking this for the first month he saw no difference at all and rashes.  So he discontinued the medication.  Otherwise has not noticed any major change in symptoms.  Due to the genital rash he had STI screening in primary care office that was negative.  1 new lesion from a abrasion on his ankle that he noticed has been very slow to heal present for the past 2 weeks.  Previous HPI 02/07/2023 Eduardo Parker is a 42 y.o. 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  He started taking the mycophenolate 500 mg twice daily for about 2 months so far has not seen any major difference in symptoms also no trouble taking the medicine.  Still with active rashes throughout his whole back.  No new swollen joints has been suffering some pain and soreness in his neck for at least the past 1 week.  He is not sure if it is related to the large lymph nodes. He is also noticed a new small erythematous rash on the edge of his glans.  No other lesions in his genital area and no palpable adenopathy.  Not associated with any dysuria, frequency, hematuria, or discharge.   Previous HPI 12/07/2022 Eduardo Parker is a 42 y.o. 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  He started on Imuran has been taking 100 mg daily now for about 2 months.  So far has not seen any significant improvement in symptoms.  Not noticed any  side effect or problem with the medication either.  Still has extensive rashes throughout his back and palpable nontender nodules around the neck.   Previous HPI 08/25/22 Eduardo Parker is a 42 y.o. male here for sarcoidosis.  He was originally diagnosed with sarcoidosis after hospitalization for acute kidney injury with hypercalcemia and diffuse lymphadenopathy with granulomatous lymphadenitis findings on biopsy in July 2020.  Was also found to have some papular dermatitis involvement as well as left I inflammation with decreased visual acuity.  He was treated with a course of oral steroids with improvement of his lymphadenopathy symptoms. He saw Dr. Sherryll Burger with findings indicative for inflammatory eye disease and was started on treatment with methotrexate but looks like he did not stay on the medication longer than 2 months.  He reports seeing no appreciable difference in symptoms when taking the medication.  He is pretty much had persistent painless lymph node swelling in multiple areas and decreased visual acuity in the left eye.  In the past few months skin rash has become more extensive now covering most of his back he was prescribed topical steroids for use but does not find is feasible over the large surface area have not seen a great response with the use so far.  He reports never having good exercise tolerance going back  years but no particular cough or worsened dyspnea on exertion.  No joint pain or swelling.  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   HBV/HCV neg 04/2019   Review of Systems  Constitutional:  Negative for fatigue.  HENT:  Positive for mouth dryness. Negative for mouth sores.   Eyes:  Negative for dryness.  Respiratory:  Negative for shortness of breath.   Cardiovascular:  Negative for chest pain and palpitations.  Gastrointestinal:  Negative for blood in stool, constipation and diarrhea.   Endocrine: Negative for increased urination.  Genitourinary:  Negative for involuntary urination.  Musculoskeletal:  Negative for joint pain, gait problem, joint pain, joint swelling, myalgias, muscle weakness, morning stiffness, muscle tenderness and myalgias.  Skin:  Negative for color change, rash, hair loss and sensitivity to sunlight.  Allergic/Immunologic: Negative for susceptible to infections.  Neurological:  Negative for dizziness and headaches.  Hematological:  Positive for swollen glands.  Psychiatric/Behavioral:  Positive for depressed mood. Negative for sleep disturbance. The patient is nervous/anxious.     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   Constipation 11/11/2018   Family history of colon cancer 11/11/2018   Hypercalcemia 11/09/2018   Splenomegaly 11/09/2018   Mass of left side of neck 07/22/2018   Cervical lymphadenopathy 07/22/2018   Gastroesophageal reflux disease with esophagitis 07/12/2018   Deviated nasal septum 05/30/2017   Environmental allergies 05/30/2017   Dysthymia 05/30/2017   Elevated blood pressure reading 05/30/2017   Alcohol induced fatty liver 05/22/2017   Thiamine deficiency 05/22/2017   Elevated ALT measurement 08/23/2016   Low libido 08/22/2016   Hypertriglyceridemia without hypercholesterolemia 04/29/2015   Right groin mass 04/28/2015   Generalized anxiety disorder 03/24/2015    Past Medical History:  Diagnosis Date   Alcohol induced fatty liver 05/22/2017   Anxiety    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    Hypertriglyceridemia without hypercholesterolemia 04/29/2015   Type 2 diabetes mellitus with hyperglycemia, without long-term current use of insulin (HCC) 04/22/2020    Family History  Problem Relation Age of Onset   Breast cancer Mother    Depression Mother    Depression Father    Bipolar disorder Father    Colon cancer Paternal Grandfather    Past Surgical History:  Procedure Laterality Date   LYMPH NODE BIOPSY  2020   VASECTOMY     Social History   Social History Narrative   Not on file   Immunization History  Administered Date(s) Administered   Pneumococcal Polysaccharide-23 05/04/2020   Tdap 11/12/2020     Objective: Vital Signs: BP 128/83 (BP Location: Left Arm, Patient Position: Sitting, Cuff Size: Normal)   Pulse 67   Resp 14   Ht 6\' 4"  (1.93 m)   Wt 266 lb (120.7 kg)   BMI 32.38 kg/m    Physical Exam Eyes:     Conjunctiva/sclera: Conjunctivae normal.  Cardiovascular:     Rate and Rhythm: Normal rate and regular rhythm.  Pulmonary:     Effort: Pulmonary effort is normal.  Breath sounds: Normal breath sounds.  Musculoskeletal:     Right lower leg: No edema.     Left lower leg: No edema.  Lymphadenopathy:     Cervical: Cervical adenopathy present.  Skin:    General: Skin is warm and dry.     Findings: Rash present.     Comments: Rash extensively across entire back with slightly raised erythematous papules each 2 to 3 cm diameter no overlying scaling  Neurological:     Mental Status: He is alert.  Psychiatric:        Mood and Affect: Mood normal.      Musculoskeletal Exam:  Shoulders full ROM no tenderness or swelling Elbows full ROM no tenderness or swelling Wrists full ROM no tenderness or swelling Fingers full ROM no tenderness or swelling Knees full ROM no tenderness or swelling Ankles full ROM no tenderness or swelling   Investigation: No additional findings.  Imaging: No results found.  Recent Labs: Lab Results  Component  Value Date   WBC 4.1 02/07/2023   HGB 15.2 02/07/2023   PLT 256 02/07/2023   NA 139 02/07/2023   K 3.8 02/07/2023   CL 102 02/07/2023   CO2 26 02/07/2023   GLUCOSE 86 02/07/2023   BUN 19 02/07/2023   CREATININE 1.28 02/07/2023   BILITOT 1.2 02/07/2023   ALKPHOS 81 08/22/2016   AST 33 02/07/2023   ALT 52 (H) 02/07/2023   PROT 7.2 02/07/2023   ALBUMIN 4.3 08/22/2016   ALBUMIN 4.3 08/22/2016   CALCIUM 11.0 (H) 02/07/2023   GFRAA 76 04/21/2020   QFTBGOLDPLUS NEGATIVE 08/25/2022    Speciality Comments: No specialty comments available.  Procedures:  No procedures performed Allergies: Penicillins   Assessment / Plan:     Visit Diagnoses: Sarcoidosis of lymph nodes - Plan: azaTHIOprine (IMURAN) 50 MG tablet  So far lack of treatment response to azathioprine and mycophenolate.  Seeking to avoid prolonged oral corticosteroids due to side effects and also he completely relapsed after previous steroid treatment for his sarcoidosis with significant hypercalcemia.  Recommend to start on infliximab infusion.  Will also have him resume azathioprine at just 50 mg once daily for accompanying IMT.  High risk medication use  Not repeating monitoring labs today as he is off the mycophenolate treatment since last month.  Previous baseline results were all fine mildly abnormal LFTs likely related to chronic alcohol use.  Discussed risks of infliximab including infusion reactions, cytopenias, hepatotoxicity, infections, lymphoma, or nonmelanoma skin cancers.  He has no personal history of heart failure no history of cancers.  Orders: No orders of the defined types were placed in this encounter.  Meds ordered this encounter  Medications   azaTHIOprine (IMURAN) 50 MG tablet    Sig: Take 1 tablet (50 mg total) by mouth daily.    Dispense:  90 tablet    Refill:  0     Follow-Up Instructions: Return in about 3 months (around 07/09/2023) for Sarcoidosis IFX start.   Fuller Plan,  MD  Note - This record has been created using [employer].  Chart creation errors have been sought, but may not always  have been located. Such creation errors do not reflect on  the standard of medical care.

## 2023-04-10 ENCOUNTER — Ambulatory Visit: Payer: BC Managed Care – PPO | Attending: Internal Medicine | Admitting: Internal Medicine

## 2023-04-10 ENCOUNTER — Encounter: Payer: Self-pay | Admitting: Internal Medicine

## 2023-04-10 VITALS — BP 128/83 | HR 67 | Resp 14 | Ht 76.0 in | Wt 266.0 lb

## 2023-04-10 DIAGNOSIS — R21 Rash and other nonspecific skin eruption: Secondary | ICD-10-CM

## 2023-04-10 DIAGNOSIS — D861 Sarcoidosis of lymph nodes: Secondary | ICD-10-CM | POA: Diagnosis not present

## 2023-04-10 DIAGNOSIS — Z79899 Other long term (current) drug therapy: Secondary | ICD-10-CM | POA: Diagnosis not present

## 2023-04-10 MED ORDER — AZATHIOPRINE 50 MG PO TABS
50.0000 mg | ORAL_TABLET | Freq: Every day | ORAL | 0 refills | Status: DC
Start: 2023-04-10 — End: 2023-10-18

## 2023-04-10 NOTE — Patient Instructions (Signed)
 Infliximab Injection What is this medication? INFLIXIMAB (in FLIX i mab) treats autoimmune conditions, such as psoriasis, arthritis, Crohn's disease, and ulcerative colitis. It works by slowing down an overactive immune system. It belongs to a group of medications called TNF inhibitors. It is a monoclonal antibody. This medicine may be used for other purposes; ask your health care provider or pharmacist if you have questions. COMMON BRAND NAME(S): AVSOLA, INFLECTRA, IXIFI, Remicade, RENFLEXIS, Zymfentra What should I tell my care team before I take this medication? They need to know if you have any of these conditions: Cancer Current or past resident of [place] or [place] Diabetes Exposure to tuberculosis Guillain-Barre syndrome Heart failure Liver disease Immune system problems Infection Lung or breathing disease, such as COPD Multiple sclerosis Receiving phototherapy for the skin Seizure disorder An unusual or allergic reaction to infliximab, mouse proteins, other medications, foods, dyes, or preservatives Pregnant or trying to get pregnant Breast-feeding How should I use this medication? This medication is injected into a vein or under the skin. If it is injected into a vein, it is given by your care team in a hospital or clinic setting. If it is injected under the skin, it may be given at home. If you get this medication at home, you will be taught how to prepare and give it. Use exactly as directed. Take it as directed on the prescription label at the same time every day. Keep taking it unless your care team tells you to stop. It is important that you put your used needles and syringes in a special sharps container. Do not put them in a trash can. If you do not have a sharps container, call your pharmacist or care team to get one. A special MedGuide will be given to you by the pharmacist with each prescription and refill. Be sure to read this information carefully each  time. Talk to your care team about the use of this medication in children. While it may be prescribed for children as young as 33 years of age for selected conditions, precautions do apply. Overdosage: If you think you have taken too much of this medicine contact a poison control center or emergency room at once. NOTE: This medicine is only for you. Do not share this medicine with others. What if I miss a dose? If you get this medication at the hospital or clinic: It is important not to miss your dose. Call your care team if you are unable to keep an appointment. If you give yourself this medication at home: If you miss a dose, take it as soon as you can. Then give the next dose 2 weeks later and continue your normal schedule. If it is almost time for your next dose, take only that dose. Do not take double or extra doses. Call your care team with questions. What may interact with this medication? Do not take this medication with any of the following: Biologic medications, such as abatacept, adalimumab, anakinra, certolizumab, etanercept, golimumab, rituximab, secukinumab, tocilizumab, tofactinib, ustekinumab Live vaccines This list may not describe all possible interactions. Give your health care provider a list of all the medicines, herbs, non-prescription drugs, or dietary supplements you use. Also tell them if you smoke, drink alcohol, or use illegal drugs. Some items may interact with your medicine. What should I watch for while using this medication? Visit your care team for regular checks on your progress. Your condition will be monitored carefully while you are receiving this medication. You may  need blood work done while you are taking this medication. You will be tested for tuberculosis (TB) before you start this medication. If your care team prescribes any medication for TB, you should start taking the TB medication before starting this medication. Make sure to finish the full course of TB  medication. This medication may increase your risk of getting an infection. Call your care team for advice if you get a fever, chills, sore throat, or other symptoms of a cold or flu. Do not treat yourself. Try to avoid being around people who are sick. This medication may make the symptoms of heart failure worse in some patients. Contact your care team right away if you develop signs or symptoms of heart failure. Before having surgery or dental work, talk to your care team to make sure it is ok. This medication can increase the risk of poor healing of your surgical site or wound. If you take this medication for plaque psoriasis, stay out of the sun. If you cannot avoid being in the sun, wear protective clothing and sunscreen. Do not use sun lamps or tanning beds/booths. Talk to your care team about your risk of cancer. You may be more at risk for certain types of cancers if you take this medication. What side effects may I notice from receiving this medication? Side effects that you should report to your care team as soon as possible: Allergic reactions--skin rash, itching, hives, swelling of the face, lips, tongue, or throat Body pain, tingling, or numbness Heart attack--pain or tightness in the chest, shoulders, arms, or jaw, nausea, shortness of breath, cold or clammy skin, feeling faint or lightheaded Heart failure--shortness of breath, swelling of the ankles, feet, or hands, sudden weight gain, unusual weakness or fatigue Heart rhythm changes--fast or irregular heartbeat, dizziness, feeling faint or lightheaded, chest pain, trouble breathing Increase in blood pressure Infection--fever, chills, cough, sore throat, wounds that don't heal, pain or trouble when passing urine, general feeling of discomfort or being unwell Liver injury--right upper belly pain, loss of appetite, nausea, light-colored stool, dark yellow or brown urine, yellowing skin or eyes, unusual weakness or fatigue Low blood  pressure--dizziness, feeling faint or lightheaded, blurry vision Lupus-like syndrome--joint pain, swelling, or stiffness, butterfly-shaped rash on the face, rashes that get worse in the sun, fever, unusual weakness or fatigue Seizures Stroke--sudden numbness or weakness of the face, arm, or leg, trouble speaking, confusion, trouble walking, loss of balance or coordination, dizziness, severe headache, change in vision Sudden vision loss in one or both eyes Unusual bruising or bleeding Side effects that usually do not require medical attention (report to your care team if they continue or are bothersome): Cough Diarrhea Fatigue Headache Nausea Runny or stuffy nose Sore throat Stomach pain This list may not describe all possible side effects. Call your doctor for medical advice about side effects. You may report side effects to FDA at 1-800-FDA-1088. Where should I keep my medication? Keep out of the reach of children and pets. See product for storage information. Each product may have different instructions. Get rid of any unused medication after the expiration date. To get rid of medications that are no longer needed or have expired: Take the medication to a medication take-back program. Check with your pharmacy or law enforcement to find a location. If you cannot return the medication, ask your pharmacist or care team how to get rid of this medication safely. NOTE: This sheet is a summary. It may not cover all possible information. If  you have questions about this medicine, talk to your doctor, pharmacist, or health care provider.  2024 Elsevier/Gold Standard (2022-04-06 00:00:00)

## 2023-04-11 ENCOUNTER — Other Ambulatory Visit: Payer: Self-pay | Admitting: Pharmacist

## 2023-04-11 NOTE — Progress Notes (Signed)
Therapy plan placed for Avsola [phone]) for [facility] to start benefits investigation  Diagnosis: sarcoidosis with cutaneous and cervical and mediastinal lymphadenopathy involvement   Provider: Dr. Sheliah Hatch  Dose: 3 mg/kg at Week 0, 2, 6 then every 8 weeks   Last Clinic Visit: 04/10/23 Next Clinic Visit: 07/10/2023  Pertinent Labs: Hepatitis C negative on 02/28/2023, HIV negative on 02/28/2023, TB gold negative on 08/25/22,  hepatitis panel negative on 05/20/19 (CareEverywhere), CBC updated on 02/07/23  Chesley Mires, PharmD, MPH, BCPS, CPP Clinical Pharmacist (Rheumatology and Pulmonology)

## 2023-04-21 ENCOUNTER — Telehealth: Payer: Self-pay | Admitting: Pharmacy Technician

## 2023-04-21 NOTE — Telephone Encounter (Signed)
Devki, Fyi note:  Patient will be scheduled as soon as possible.  Auth Submission: APPROVED Site of care: Site of care: [facility] Payer: BCBS Medication & CPT/J Code(s) submitted: Avsola (infliximab-axxq) [id number] Route of submission (phone, fax, portal):  Phone # [phone] Fax # Auth type: Buy/Bill PB Units/visits requested: 3mg /kg - 400mg  day0, day14, day42, then q8wks Reference number: [id number] Approval from: 04/12/23 to 04/10/24    Co-pay card: Pending [employer] aware

## 2023-04-24 NOTE — Telephone Encounter (Signed)
Copay card information is as follows:  [employer] support ID: [id number] Group: [id number] BIN: [id number]  Virtual card information: [id number] Exp. 10/27 CVC 393

## 2023-05-04 ENCOUNTER — Ambulatory Visit (INDEPENDENT_AMBULATORY_CARE_PROVIDER_SITE_OTHER): Payer: BC Managed Care – PPO

## 2023-05-04 VITALS — BP 146/83 | HR 63 | Temp 98.1°F | Resp 14 | Ht 76.0 in | Wt 259.0 lb

## 2023-05-04 DIAGNOSIS — Z79899 Other long term (current) drug therapy: Secondary | ICD-10-CM | POA: Diagnosis not present

## 2023-05-04 DIAGNOSIS — D861 Sarcoidosis of lymph nodes: Secondary | ICD-10-CM | POA: Diagnosis not present

## 2023-05-04 DIAGNOSIS — I888 Other nonspecific lymphadenitis: Secondary | ICD-10-CM

## 2023-05-04 DIAGNOSIS — D863 Sarcoidosis of skin: Secondary | ICD-10-CM | POA: Diagnosis not present

## 2023-05-04 MED ORDER — SODIUM CHLORIDE 0.9 % IV SOLN
3.0000 mg/kg | Freq: Once | INTRAVENOUS | Status: AC
  Administered 2023-05-04: 400 mg via INTRAVENOUS
  Filled 2023-05-04: qty 40

## 2023-05-04 MED ORDER — ACETAMINOPHEN 325 MG PO TABS
650.0000 mg | ORAL_TABLET | Freq: Once | ORAL | Status: AC
  Administered 2023-05-04: 650 mg via ORAL
  Filled 2023-05-04: qty 2

## 2023-05-04 MED ORDER — DIPHENHYDRAMINE HCL 25 MG PO CAPS
25.0000 mg | ORAL_CAPSULE | Freq: Once | ORAL | Status: AC
  Administered 2023-05-04: 25 mg via ORAL
  Filled 2023-05-04: qty 1

## 2023-05-04 NOTE — Progress Notes (Signed)
 Diagnosis: Sarcoidosis, Granulomatous lymphadenitis   Provider:  Mannam, Praveen MD  Procedure: IV Infusion  IV Type: Peripheral, IV Location: L Hand  Avsola  (infliximab -axxq), Dose: 400 mg  Infusion Start Time: 1346  Infusion Stop Time: 1604  Post Infusion IV Care: Observation period completed and Peripheral IV Discontinued  Discharge: Condition: Good, Destination: Home . AVS Declined  Performed by:  Rocky FORBES Sar, RN

## 2023-05-18 ENCOUNTER — Ambulatory Visit: Payer: BC Managed Care – PPO

## 2023-05-18 ENCOUNTER — Other Ambulatory Visit: Payer: BC Managed Care – PPO

## 2023-05-18 VITALS — BP 142/92 | HR 63 | Temp 98.3°F | Resp 18 | Ht 76.0 in | Wt 258.4 lb

## 2023-05-18 DIAGNOSIS — I888 Other nonspecific lymphadenitis: Secondary | ICD-10-CM

## 2023-05-18 DIAGNOSIS — D861 Sarcoidosis of lymph nodes: Secondary | ICD-10-CM | POA: Diagnosis not present

## 2023-05-18 DIAGNOSIS — D863 Sarcoidosis of skin: Secondary | ICD-10-CM | POA: Diagnosis not present

## 2023-05-18 DIAGNOSIS — Z79899 Other long term (current) drug therapy: Secondary | ICD-10-CM | POA: Diagnosis not present

## 2023-05-18 LAB — CBC WITH DIFFERENTIAL/PLATELET
Basophils Absolute: 0.1 10*3/uL (ref 0.0–0.1)
Basophils Relative: 1.3 % (ref 0.0–3.0)
Eosinophils Absolute: 0.3 10*3/uL (ref 0.0–0.7)
Eosinophils Relative: 7 % — ABNORMAL HIGH (ref 0.0–5.0)
HCT: 47.1 % (ref 39.0–52.0)
Hemoglobin: 16.3 g/dL (ref 13.0–17.0)
Lymphocytes Relative: 19.9 % (ref 12.0–46.0)
Lymphs Abs: 0.9 10*3/uL (ref 0.7–4.0)
MCHC: 34.5 g/dL (ref 30.0–36.0)
MCV: 97.4 fL (ref 78.0–100.0)
Monocytes Absolute: 0.6 10*3/uL (ref 0.1–1.0)
Monocytes Relative: 12.8 % — ABNORMAL HIGH (ref 3.0–12.0)
Neutro Abs: 2.6 10*3/uL (ref 1.4–7.7)
Neutrophils Relative %: 59 % (ref 43.0–77.0)
Platelets: 189 10*3/uL (ref 150.0–400.0)
RBC: 4.84 Mil/uL (ref 4.22–5.81)
RDW: 13.6 % (ref 11.5–15.5)
WBC: 4.4 10*3/uL (ref 4.0–10.5)

## 2023-05-18 LAB — COMPREHENSIVE METABOLIC PANEL
ALT: 45 U/L (ref 0–53)
AST: 29 U/L (ref 0–37)
Albumin: 4.8 g/dL (ref 3.5–5.2)
Alkaline Phosphatase: 49 U/L (ref 39–117)
BUN: 15 mg/dL (ref 6–23)
CO2: 26 meq/L (ref 19–32)
Calcium: 10 mg/dL (ref 8.4–10.5)
Chloride: 105 meq/L (ref 96–112)
Creatinine, Ser: 1.14 mg/dL (ref 0.40–1.50)
GFR: 78.22 mL/min (ref >60.00)
Glucose, Bld: 85 mg/dL (ref 70–99)
Potassium: 3.9 meq/L (ref 3.5–5.1)
Sodium: 139 meq/L (ref 135–145)
Total Bilirubin: 1.2 mg/dL (ref 0.2–1.2)
Total Protein: 7.3 g/dL (ref 6.0–8.3)

## 2023-05-18 MED ORDER — DIPHENHYDRAMINE HCL 25 MG PO CAPS
25.0000 mg | ORAL_CAPSULE | Freq: Once | ORAL | Status: AC
  Administered 2023-05-18: 25 mg via ORAL
  Filled 2023-05-18: qty 1

## 2023-05-18 MED ORDER — ACETAMINOPHEN 325 MG PO TABS
650.0000 mg | ORAL_TABLET | Freq: Once | ORAL | Status: AC
  Administered 2023-05-18: 650 mg via ORAL
  Filled 2023-05-18: qty 2

## 2023-05-18 MED ORDER — SODIUM CHLORIDE 0.9 % IV SOLN
3.0000 mg/kg | Freq: Once | INTRAVENOUS | Status: AC
  Administered 2023-05-18: 400 mg via INTRAVENOUS
  Filled 2023-05-18: qty 40

## 2023-05-18 NOTE — Progress Notes (Signed)
Diagnosis: Sarcoidosis, Granulomatous Lymphadenitis  Provider:  Chilton Greathouse MD  Procedure: IV Infusion  IV Type: Peripheral, IV Location: L Hand  Avsola (infliximab-axxq), Dose: 400 mg  Infusion Start Time: 1349  Infusion Stop Time: 1608  Post Infusion IV Care: Peripheral IV Discontinued  Discharge: Condition: Good, Destination: Home . AVS Provided  Performed by:  Adriana Mccallum, RN

## 2023-06-15 ENCOUNTER — Ambulatory Visit: Payer: BC Managed Care – PPO

## 2023-06-15 VITALS — BP 142/83 | HR 64 | Temp 97.7°F | Resp 20 | Ht 76.0 in | Wt 264.0 lb

## 2023-06-15 DIAGNOSIS — Z79899 Other long term (current) drug therapy: Secondary | ICD-10-CM | POA: Diagnosis not present

## 2023-06-15 DIAGNOSIS — I888 Other nonspecific lymphadenitis: Secondary | ICD-10-CM

## 2023-06-15 DIAGNOSIS — D863 Sarcoidosis of skin: Secondary | ICD-10-CM | POA: Diagnosis not present

## 2023-06-15 DIAGNOSIS — D861 Sarcoidosis of lymph nodes: Secondary | ICD-10-CM

## 2023-06-15 MED ORDER — SODIUM CHLORIDE 0.9 % IV SOLN
3.0000 mg/kg | Freq: Once | INTRAVENOUS | Status: AC
  Administered 2023-06-15: 400 mg via INTRAVENOUS
  Filled 2023-06-15: qty 40

## 2023-06-15 MED ORDER — ACETAMINOPHEN 325 MG PO TABS
650.0000 mg | ORAL_TABLET | Freq: Once | ORAL | Status: AC
  Administered 2023-06-15: 650 mg via ORAL
  Filled 2023-06-15: qty 2

## 2023-06-15 MED ORDER — DIPHENHYDRAMINE HCL 25 MG PO CAPS
25.0000 mg | ORAL_CAPSULE | Freq: Once | ORAL | Status: AC
Start: 2023-06-15 — End: 2023-06-15
  Administered 2023-06-15: 25 mg via ORAL
  Filled 2023-06-15: qty 1

## 2023-06-15 NOTE — Progress Notes (Signed)
 Diagnosis:   Sarcoidosis of lymph nodes    Provider:  Chilton Greathouse MD  Procedure: IV Infusion  IV Type: Peripheral, IV Location: L Hand  Avsola (infliximab-axxq), Dose: 400 mg  Infusion Start Time: 1338  Infusion Stop Time: 1548  Post Infusion IV Care: Peripheral IV Discontinued  Discharge: Condition: Good, Destination: Home . AVS Provided  Performed by:  Loney Hering, LPN

## 2023-06-20 ENCOUNTER — Ambulatory Visit: Payer: BC Managed Care – PPO | Admitting: Family Medicine

## 2023-06-22 ENCOUNTER — Encounter: Payer: Self-pay | Admitting: Family Medicine

## 2023-06-22 ENCOUNTER — Ambulatory Visit: Payer: BC Managed Care – PPO | Admitting: Family Medicine

## 2023-06-22 VITALS — BP 143/96 | HR 65 | Ht 76.0 in | Wt 264.5 lb

## 2023-06-22 DIAGNOSIS — E119 Type 2 diabetes mellitus without complications: Secondary | ICD-10-CM | POA: Insufficient documentation

## 2023-06-22 DIAGNOSIS — Z7985 Long-term (current) use of injectable non-insulin antidiabetic drugs: Secondary | ICD-10-CM

## 2023-06-22 DIAGNOSIS — E1165 Type 2 diabetes mellitus with hyperglycemia: Secondary | ICD-10-CM | POA: Diagnosis not present

## 2023-06-22 DIAGNOSIS — I10 Essential (primary) hypertension: Secondary | ICD-10-CM | POA: Diagnosis not present

## 2023-06-22 DIAGNOSIS — D861 Sarcoidosis of lymph nodes: Secondary | ICD-10-CM

## 2023-06-22 LAB — POCT GLYCOSYLATED HEMOGLOBIN (HGB A1C): Hemoglobin A1C: 4.8 % (ref 4.0–5.6)

## 2023-06-22 MED ORDER — OZEMPIC (0.25 OR 0.5 MG/DOSE) 2 MG/3ML ~~LOC~~ SOPN: PEN_INJECTOR | SUBCUTANEOUS | 1 refills | Status: DC

## 2023-06-22 NOTE — Progress Notes (Signed)
 Eduardo Parker - 43 y.o. male MRN [id number]  Date of birth: [birth year]  Subjective Chief Complaint  Patient presents with   Medical Management of Chronic Issues    T2DM f/u last A1C 4.5 HTN    HPI Eduardo Parker is a 43 y.o. male here today for follow up visit.   He reports that he is doing well.  Continues to see rheumatology for management of sarcoidosis.   History of diabetes and previously treated with Ozempic but discontinued due to cost.  He was making changes to diet and lifestyle and blood sugars and BP were well controlled at last visit in in August.  He remains on track with these changes and A1c is 4.8% today.    Mood remains stable with effexor.  No side effects with this at current strength.    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   Hypertriglyceridemia without hypercholesterolemia 04/29/2015   Type 2 diabetes mellitus with hyperglycemia, without long-term current use of insulin (HCC) 04/22/2020    Past Surgical History:  Procedure Laterality Date   LYMPH NODE BIOPSY  2020   VASECTOMY      Social History   Socioeconomic History   Marital status: Single    Spouse name: Not on file   Number of children: Not on file   Years of education: Not on file   Highest education level: Not on file  Occupational History   Not on file  Tobacco Use   Smoking status: Never    Passive exposure: Past   Smokeless tobacco: Never  Vaping Use   Vaping status: Never Used  Substance and Sexual Activity   Alcohol use: Yes    Alcohol/week: 50.0 standard drinks of alcohol    Types: 50 Standard drinks or equivalent per week    Comment: 8 drinks daily   Drug use: No   Sexual activity: Yes    Birth control/protection: None  Other Topics  Concern   Not on file  Social History Narrative   Not on file   Social Drivers of Health   Financial Resource Strain: Not on file  Food Insecurity: Not on file  Transportation Needs: Not on file  Physical Activity: Not on file  Stress: Not on file  Social Connections: Unknown (08/24/2021)   Received from [facility]   Social Network    Social Network: Not on file    Family History  Problem Relation Age of Onset   Breast cancer Mother    Depression Mother    Depression Father    Bipolar disorder Father    Colon cancer Paternal Grandfather     Health Maintenance  Topic Date Due   Pneumococcal Vaccine 42-62 Years old (2 of 2 - PCV) 05/04/2021   OPHTHALMOLOGY EXAM  05/27/2021   Diabetic kidney evaluation - Urine ACR  03/17/2023   COVID-19 Vaccine (1) 07/03/2023 (Originally 11/13/1985)   INFLUENZA VACCINE  07/24/2023 (Originally 11/24/2022)   FOOT EXAM  12/16/2023   HEMOGLOBIN A1C  12/20/2023   Diabetic kidney evaluation - eGFR measurement  05/17/2024   DTaP/Tdap/Td (2 - Td or Tdap) 11/13/2030   Hepatitis C Screening  Completed   HIV Screening  Completed   HPV VACCINES  Aged Out     ----------------------------------------------------------------------------------------------------------------------------------------------------------------------------------------------------------------- Physical Exam BP (!) 143/96 (BP Location: Left Arm, Patient Position: Sitting, Cuff Size: Large)   Pulse 65   Ht 6\' 4"  (1.93 m)   Wt 264 lb 8 oz (120 kg)   SpO2 97%   BMI 32.20 kg/m   Physical Exam Constitutional:      Appearance: Normal appearance.  HENT:     Head: Normocephalic and atraumatic.  Eyes:     General: No scleral icterus. Cardiovascular:     Rate and Rhythm: Normal rate and regular rhythm.  Pulmonary:     Effort: Pulmonary effort is normal.     Breath sounds: Normal breath sounds.  Musculoskeletal:     Cervical back: Neck supple.  Neurological:      Mental Status: He is alert.  Psychiatric:        Mood and Affect: Mood normal.        Behavior: Behavior normal.     ------------------------------------------------------------------------------------------------------------------------------------------------------------------------------------------------------------------- Assessment and Plan  Essential hypertension BP is mildly elevated today.  Return in 2 weeks for BP recheck.   Type 2 diabetes mellitus without complications (HCC) A1c 10.1% at time of diagnosis.  He has done well with making diet and lifestyle changes.  Ozempic has worked well for him previously and he would like to restart this.  Updated rx sent in.   Sarcoidosis of lymph nodes Management per rheumatology, fairly stable at this time.    Meds ordered this encounter  Medications   Semaglutide,0.25 or 0.5MG /DOS, (OZEMPIC, 0.25 OR 0.5 MG/DOSE,) 2 MG/3ML SOPN    Sig: 0.25mg  weekly x4 weeks then increase to 0.5mg  weekly    Dispense:  3 mL    Refill:  1    No follow-ups on file.    This visit occurred during the SARS-CoV-2 public health emergency.  Safety protocols were in place, including screening questions prior to the visit, additional usage of staff PPE, and extensive cleaning of exam room while observing appropriate contact time as indicated for disinfecting solutions.

## 2023-06-22 NOTE — Assessment & Plan Note (Signed)
 A1c 10.1% at time of diagnosis.  He has done well with making diet and lifestyle changes.  Ozempic has worked well for him previously and he would like to restart this.  Updated rx sent in.

## 2023-06-22 NOTE — Assessment & Plan Note (Signed)
 Management per rheumatology, fairly stable at this time.

## 2023-06-22 NOTE — Assessment & Plan Note (Signed)
 BP is mildly elevated today.  Return in 2 weeks for BP recheck.

## 2023-07-03 NOTE — Progress Notes (Signed)
 Office Visit Note  Patient: Eduardo Parker             Date of Birth: July 10, 1980           MRN: [id number]             PCP: Everrett Coombe, DO Referring: Everrett Coombe, DO Visit Date: 07/17/2023   Subjective:  Follow-up   Discussed the use of AI scribe software for clinical note transcription with the patient, who gave verbal consent to proceed.  History of Present Illness   Eduardo Parker is a 43 y.o. male here for follow up for sarcoidosis with cutaneous and cervical and mediastinal lymphadenopathy involvement now on Avsola infusion 3 mg/kg q8wks after initial loading doses in January and February.  He has been on Avsola since January to manage his sarcoidosis. Since starting the treatment, his skin lesions, which were previously indurated and nodular, have flattened significantly, although they remain marked. The lesions are no longer raised, and there is no induration. He notes a significant improvement in his condition over the past couple of months.  His lymph nodes, which were previously enlarged, are now less noticeable. They are no longer visible from a distance, although he can still feel them. He has mediastinal adenopathy and skin involvement with sarcoidosis. No coronary symptoms are present.  He reports no adverse reactions to the Avsola infusions, such as swelling, and has not noticed any significant changes or side effects from the infusions.  He mentions a recent cold that lingered longer than expected and a finger injury that took longer to heal, which he attributes to the medication. The finger was described as puffed up and required him to pop it and apply Neosporin for healing.  He has a history of high calcium levels and elevated ACE levels, which were previously noted. He has tried other oral medications, including Cellcept and azathioprine, but they did not significantly impact his condition. Methotrexate was not tried due to his alcohol use. Previous eye  exam with Dr. Sherryll Burger negative for uveitis prior to treatment.   Previous HPI 04/10/2023 Eduardo Parker is a 43 y.o. 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  He tried increasing mycophenolate to 1000 mg twice daily but despite taking this for the first month he saw no difference at all and rashes.  So he discontinued the medication.  Otherwise has not noticed any major change in symptoms.  Due to the genital rash he had STI screening in primary care office that was negative.  1 new lesion from a abrasion on his ankle that he noticed has been very slow to heal present for the past 2 weeks.   Previous HPI 02/07/2023 Eduardo Parker is a 43 y.o. 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  He started taking the mycophenolate 500 mg twice daily for about 2 months so far has not seen any major difference in symptoms also no trouble taking the medicine.  Still with active rashes throughout his whole back.  No new swollen joints has been suffering some pain and soreness in his neck for at least the past 1 week.  He is not sure if it is related to the large lymph nodes. He is also noticed a new small erythematous rash on the edge of his glans.  No other lesions in his genital area and no palpable adenopathy.  Not associated with any  dysuria, frequency, hematuria, or discharge.   Previous HPI 12/07/2022 Eduardo Parker is a 43 y.o. 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  He started on Imuran has been taking 100 mg daily now for about 2 months.  So far has not seen any significant improvement in symptoms.  Not noticed any side effect or problem with the medication either.  Still has extensive rashes throughout his back and palpable nontender nodules around the neck.   Previous HPI 08/25/22 Eduardo Parker is a 42 y.o. male here for sarcoidosis.  He was originally diagnosed with sarcoidosis after hospitalization for acute kidney injury with hypercalcemia and diffuse lymphadenopathy with granulomatous lymphadenitis findings on biopsy in July 2020.  Was also found to have some papular dermatitis involvement as well as left I inflammation with decreased visual acuity.  He was treated with a course of oral steroids with improvement of his lymphadenopathy symptoms. He saw Dr. Sherryll Burger with findings indicative for inflammatory eye disease and was started on treatment with methotrexate but looks like he did not stay on the medication longer than 2 months.  He reports seeing no appreciable difference in symptoms when taking the medication.  He is pretty much had persistent painless lymph node swelling in multiple areas and decreased visual acuity in the left eye.  In the past few months skin rash has become more extensive now covering most of his back he was prescribed topical steroids for use but does not find is feasible over the large surface area have not seen a great response with the use so far.  He reports never having good exercise tolerance going back years but no particular cough or worsened dyspnea on exertion.  No joint pain or swelling.  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   HBV/HCV neg 04/2019   Review of Systems  Constitutional:  Negative for fatigue.  HENT:  Negative for mouth sores and mouth dryness.   Eyes:  Negative for dryness.  Respiratory:  Negative for shortness of breath.   Cardiovascular:  Negative for chest pain and palpitations.  Gastrointestinal:  Negative for blood in stool, constipation and diarrhea.  Endocrine: Negative for increased urination.  Genitourinary:  Negative for involuntary urination.  Musculoskeletal:  Negative for joint pain, gait problem, joint pain, joint swelling,  myalgias, muscle weakness, morning stiffness, muscle tenderness and myalgias.  Skin:  Negative for color change, rash, hair loss and sensitivity to sunlight.  Allergic/Immunologic: Negative for susceptible to infections.  Neurological:  Negative for dizziness and headaches.  Hematological:  Negative for swollen glands.  Psychiatric/Behavioral:  Positive for depressed mood. Negative for sleep disturbance. The patient is nervous/anxious.     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   Sarcoidosis of lymph nodes 11/15/2018   Constipation 11/11/2018   Family history of colon cancer 11/11/2018   Hypercalcemia 11/09/2018   Splenomegaly 11/09/2018   Mass of left side of neck 07/22/2018   Cervical  lymphadenopathy 07/22/2018   Gastroesophageal reflux disease with esophagitis 07/12/2018   Deviated nasal septum 05/30/2017   Environmental allergies 05/30/2017   Dysthymia 05/30/2017   Elevated blood pressure reading 05/30/2017   Alcohol induced fatty liver 05/22/2017   Thiamine deficiency 05/22/2017   Elevated ALT measurement 08/23/2016   Low libido 08/22/2016   Hypertriglyceridemia without hypercholesterolemia 04/29/2015   Right groin mass 04/28/2015   Generalized anxiety disorder 03/24/2015    Past Medical History:  Diagnosis Date   Alcohol induced fatty liver 05/22/2017   Anxiety    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   Hypertriglyceridemia without hypercholesterolemia 04/29/2015   Type 2 diabetes mellitus with hyperglycemia, without  long-term current use of insulin (HCC) 04/22/2020    Family History  Problem Relation Age of Onset   Breast cancer Mother    Depression Mother    Depression Father    Bipolar disorder Father    Colon cancer Paternal Grandfather    Past Surgical History:  Procedure Laterality Date   LYMPH NODE BIOPSY  2020   VASECTOMY     Social History   Social History Narrative   Not on file   Immunization History  Administered Date(s) Administered   Pneumococcal Polysaccharide-23 05/04/2020   Tdap 11/12/2020     Objective: Vital Signs: BP (!) 147/86 (BP Location: Left Arm, Patient Position: Sitting, Cuff Size: Large)   Pulse 65   Resp 14   Ht 6\' 4"  (1.93 m)   Wt 271 lb (122.9 kg)   BMI 32.99 kg/m    Physical Exam Eyes:     Conjunctiva/sclera: Conjunctivae normal.  Cardiovascular:     Rate and Rhythm: Normal rate and regular rhythm.  Pulmonary:     Effort: Pulmonary effort is normal.     Breath sounds: Normal breath sounds.  Lymphadenopathy:     Cervical: Cervical adenopathy present.  Skin:    General: Skin is warm and dry.     Findings: Rash present.     Comments: Erythematous patches across back with no induration or tenderness  Neurological:     Mental Status: He is alert.  Psychiatric:        Mood and Affect: Mood normal.       Musculoskeletal Exam:  Shoulders full ROM no tenderness or swelling Elbows full ROM no tenderness or swelling Wrists full ROM no tenderness or swelling Fingers full ROM no tenderness or swelling Knees full ROM no tenderness or swelling Ankles full ROM no tenderness or swelling  Investigation: No additional findings.  Imaging: No results found.  Recent Labs: Lab Results  Component Value Date   WBC 4.4 05/18/2023   HGB 16.3 05/18/2023   PLT 189.0 05/18/2023   NA 139 05/18/2023   K 3.9 05/18/2023   CL 105 05/18/2023   CO2 26 05/18/2023   GLUCOSE 85 05/18/2023   BUN 15 05/18/2023   CREATININE 1.14 05/18/2023   BILITOT 1.2  05/18/2023   ALKPHOS 49 05/18/2023   AST 29 05/18/2023   ALT 45 05/18/2023   PROT 7.3 05/18/2023   ALBUMIN 4.8 05/18/2023   CALCIUM 10.0 05/18/2023   GFRAA 76 04/21/2020   QFTBGOLDPLUS NEGATIVE 08/25/2022    Speciality Comments: No specialty comments available.  Procedures:  No procedures performed Allergies: Penicillins   Assessment / Plan:     Visit Diagnoses: Sarcoidosis of lymph nodes - Plan: Sedimentation rate, Angiotensin converting enzyme Chronic sarcoidosis with large adenopathy and mediastinal involvement. Partial response to Avsola with reduced lymph node size and flattened skin lesions.  Calcium and ACE levels previously elevated. Alcohol use precludes methotrexate. Previous treatments with Cellcept and azathioprine ineffective. Slightly increased infection risk due to immunosuppressive therapy. - Continue Avsola infusions 3 mg/kg q8wks, currently improving but mostly just loading doses so far - Perform blood work to monitor calcium and ACE levels for disease activity. - Educated on potential for continued improvement over six months with Avsola.  High risk medication use - Recommend to start on infliximab infusion.  Will also have him resume azathioprine at just 50 mg once daily for accompanying IMT. - Plan: CBC with Differential/Platelet, COMPLETE METABOLIC PANEL WITH GFR One slow to resolve minor finger wound with what sounds like maybe paronychia but resolved with topical antibiotic alone. No infusion reactions. No serious interval infections. -Checking CBC and CMP for medication monitoring on infliximab    Orders: Orders Placed This Encounter  Procedures   Sedimentation rate   CBC with Differential/Platelet   COMPLETE METABOLIC PANEL WITH GFR   Angiotensin converting enzyme   No orders of the defined types were placed in this encounter.    Follow-Up Instructions: Return in about 3 months (around 10/17/2023) for Sarcoidosis on IFX f/u 3mos.   Fuller Plan, MD  Note - This record has been created using [employer].  Chart creation errors have been sought, but may not always  have been located. Such creation errors do not reflect on  the standard of medical care.

## 2023-07-06 DIAGNOSIS — M546 Pain in thoracic spine: Secondary | ICD-10-CM | POA: Diagnosis not present

## 2023-07-06 DIAGNOSIS — M531 Cervicobrachial syndrome: Secondary | ICD-10-CM | POA: Diagnosis not present

## 2023-07-06 DIAGNOSIS — M9901 Segmental and somatic dysfunction of cervical region: Secondary | ICD-10-CM | POA: Diagnosis not present

## 2023-07-06 DIAGNOSIS — M25511 Pain in right shoulder: Secondary | ICD-10-CM | POA: Diagnosis not present

## 2023-07-10 ENCOUNTER — Other Ambulatory Visit: Payer: Self-pay | Admitting: Family Medicine

## 2023-07-10 ENCOUNTER — Ambulatory Visit: Payer: BC Managed Care – PPO | Admitting: Family Medicine

## 2023-07-10 ENCOUNTER — Ambulatory Visit: Payer: BC Managed Care – PPO | Admitting: Internal Medicine

## 2023-07-10 ENCOUNTER — Telehealth: Payer: Self-pay

## 2023-07-10 ENCOUNTER — Ambulatory Visit (INDEPENDENT_AMBULATORY_CARE_PROVIDER_SITE_OTHER): Admitting: Family Medicine

## 2023-07-10 VITALS — BP 154/87 | HR 84 | Temp 98.6°F | Ht 76.0 in | Wt 269.1 lb

## 2023-07-10 DIAGNOSIS — M25511 Pain in right shoulder: Secondary | ICD-10-CM | POA: Diagnosis not present

## 2023-07-10 DIAGNOSIS — I10 Essential (primary) hypertension: Secondary | ICD-10-CM

## 2023-07-10 DIAGNOSIS — M9901 Segmental and somatic dysfunction of cervical region: Secondary | ICD-10-CM | POA: Diagnosis not present

## 2023-07-10 DIAGNOSIS — M546 Pain in thoracic spine: Secondary | ICD-10-CM | POA: Diagnosis not present

## 2023-07-10 DIAGNOSIS — M531 Cervicobrachial syndrome: Secondary | ICD-10-CM | POA: Diagnosis not present

## 2023-07-10 DIAGNOSIS — R030 Elevated blood-pressure reading, without diagnosis of hypertension: Secondary | ICD-10-CM

## 2023-07-10 MED ORDER — LISINOPRIL-HYDROCHLOROTHIAZIDE 10-12.5 MG PO TABS: 1.0000 | ORAL_TABLET | Freq: Every day | ORAL | 2 refills | Status: AC

## 2023-07-10 NOTE — Progress Notes (Signed)
 Patient is here for blood pressure check. Denies trouble sleeping, palpitations, dizziness, lightheadedness, blurry vision, chest pain, shortness of breath, headaches and/or medication problems.   Patient's blood pressure was out of goal range - 151/85, pulse 82. Patient sat for 15 minutes. Blood pressure recheck was not at goal - 154/87, pulse 84. Provider notified of current blood pressure readings. Per provider, patient is to restart Lisinopril/HCTZ.   Patient informed to schedule next appointment in 2 weeks .

## 2023-07-10 NOTE — Telephone Encounter (Signed)
 Prior auth for: Ozempic Determination: Pending as of 07/10/23 Auth #: [id number] Valid from: n/a Patient notified via MyChart

## 2023-07-10 NOTE — Progress Notes (Signed)
 Medical screening examination/treatment was performed by qualified clinical staff member and as supervising physician I was immediately available for consultation/collaboration. I have reviewed documentation and agree with assessment and plan.  Everrett Coombe, DO

## 2023-07-12 DIAGNOSIS — M25511 Pain in right shoulder: Secondary | ICD-10-CM | POA: Diagnosis not present

## 2023-07-12 DIAGNOSIS — M531 Cervicobrachial syndrome: Secondary | ICD-10-CM | POA: Diagnosis not present

## 2023-07-12 DIAGNOSIS — M546 Pain in thoracic spine: Secondary | ICD-10-CM | POA: Diagnosis not present

## 2023-07-12 DIAGNOSIS — M9901 Segmental and somatic dysfunction of cervical region: Secondary | ICD-10-CM | POA: Diagnosis not present

## 2023-07-14 DIAGNOSIS — M546 Pain in thoracic spine: Secondary | ICD-10-CM | POA: Diagnosis not present

## 2023-07-14 DIAGNOSIS — M9901 Segmental and somatic dysfunction of cervical region: Secondary | ICD-10-CM | POA: Diagnosis not present

## 2023-07-14 DIAGNOSIS — M531 Cervicobrachial syndrome: Secondary | ICD-10-CM | POA: Diagnosis not present

## 2023-07-14 DIAGNOSIS — M25511 Pain in right shoulder: Secondary | ICD-10-CM | POA: Diagnosis not present

## 2023-07-17 ENCOUNTER — Encounter: Payer: Self-pay | Admitting: Internal Medicine

## 2023-07-17 ENCOUNTER — Ambulatory Visit: Payer: BC Managed Care – PPO | Attending: Internal Medicine | Admitting: Internal Medicine

## 2023-07-17 VITALS — BP 147/86 | HR 65 | Resp 14 | Ht 76.0 in | Wt 271.0 lb

## 2023-07-17 DIAGNOSIS — M546 Pain in thoracic spine: Secondary | ICD-10-CM | POA: Diagnosis not present

## 2023-07-17 DIAGNOSIS — D861 Sarcoidosis of lymph nodes: Secondary | ICD-10-CM

## 2023-07-17 DIAGNOSIS — Z79899 Other long term (current) drug therapy: Secondary | ICD-10-CM | POA: Diagnosis not present

## 2023-07-17 DIAGNOSIS — M25511 Pain in right shoulder: Secondary | ICD-10-CM | POA: Diagnosis not present

## 2023-07-17 DIAGNOSIS — M9901 Segmental and somatic dysfunction of cervical region: Secondary | ICD-10-CM | POA: Diagnosis not present

## 2023-07-17 DIAGNOSIS — M531 Cervicobrachial syndrome: Secondary | ICD-10-CM | POA: Diagnosis not present

## 2023-07-19 DIAGNOSIS — M546 Pain in thoracic spine: Secondary | ICD-10-CM | POA: Diagnosis not present

## 2023-07-19 DIAGNOSIS — M25511 Pain in right shoulder: Secondary | ICD-10-CM | POA: Diagnosis not present

## 2023-07-19 DIAGNOSIS — M531 Cervicobrachial syndrome: Secondary | ICD-10-CM | POA: Diagnosis not present

## 2023-07-19 DIAGNOSIS — M9901 Segmental and somatic dysfunction of cervical region: Secondary | ICD-10-CM | POA: Diagnosis not present

## 2023-07-19 LAB — ANGIOTENSIN CONVERTING ENZYME: Angiotensin-Converting Enzyme: 42 U/L (ref 9–67)

## 2023-07-19 LAB — CBC WITH DIFFERENTIAL/PLATELET
Absolute Lymphocytes: 1045 {cells}/uL (ref 850–3900)
Absolute Monocytes: 473 {cells}/uL (ref 200–950)
Basophils Absolute: 83 {cells}/uL (ref 0–200)
Basophils Relative: 1.5 %
Eosinophils Absolute: 292 {cells}/uL (ref 15–500)
Eosinophils Relative: 5.3 %
HCT: 45.6 % (ref 38.5–50.0)
Hemoglobin: 16.4 g/dL (ref 13.2–17.1)
MCH: 35 pg — ABNORMAL HIGH (ref 27.0–33.0)
MCHC: 36 g/dL (ref 32.0–36.0)
MCV: 97.4 fL (ref 80.0–100.0)
MPV: 10.2 fL (ref 7.5–12.5)
Monocytes Relative: 8.6 %
Neutro Abs: 3608 {cells}/uL (ref 1500–7800)
Neutrophils Relative %: 65.6 %
Platelets: 212 10*3/uL (ref 140–400)
RBC: 4.68 10*6/uL (ref 4.20–5.80)
RDW: 14.1 % (ref 11.0–15.0)
Total Lymphocyte: 19 %
WBC: 5.5 10*3/uL (ref 3.8–10.8)

## 2023-07-19 LAB — COMPLETE METABOLIC PANEL WITH GFR
AG Ratio: 2 (calc) (ref 1.0–2.5)
ALT: 58 U/L — ABNORMAL HIGH (ref 9–46)
AST: 36 U/L (ref 10–40)
Albumin: 4.7 g/dL (ref 3.6–5.1)
Alkaline phosphatase (APISO): 68 U/L (ref 36–130)
BUN: 16 mg/dL (ref 7–25)
CO2: 26 mmol/L (ref 20–32)
Calcium: 9.3 mg/dL (ref 8.6–10.3)
Chloride: 104 mmol/L (ref 98–110)
Creat: 1.09 mg/dL (ref 0.60–1.29)
Globulin: 2.4 g/dL (ref 1.9–3.7)
Glucose, Bld: 137 mg/dL — ABNORMAL HIGH (ref 65–99)
Potassium: 3.6 mmol/L (ref 3.5–5.3)
Sodium: 138 mmol/L (ref 135–146)
Total Bilirubin: 0.8 mg/dL (ref 0.2–1.2)
Total Protein: 7.1 g/dL (ref 6.1–8.1)

## 2023-07-19 LAB — SEDIMENTATION RATE: Sed Rate: 6 mm/h (ref 0–15)

## 2023-07-21 DIAGNOSIS — M546 Pain in thoracic spine: Secondary | ICD-10-CM | POA: Diagnosis not present

## 2023-07-21 DIAGNOSIS — M25511 Pain in right shoulder: Secondary | ICD-10-CM | POA: Diagnosis not present

## 2023-07-21 DIAGNOSIS — M531 Cervicobrachial syndrome: Secondary | ICD-10-CM | POA: Diagnosis not present

## 2023-07-21 DIAGNOSIS — M9901 Segmental and somatic dysfunction of cervical region: Secondary | ICD-10-CM | POA: Diagnosis not present

## 2023-07-26 DIAGNOSIS — M546 Pain in thoracic spine: Secondary | ICD-10-CM | POA: Diagnosis not present

## 2023-07-26 DIAGNOSIS — M9901 Segmental and somatic dysfunction of cervical region: Secondary | ICD-10-CM | POA: Diagnosis not present

## 2023-07-26 DIAGNOSIS — M25511 Pain in right shoulder: Secondary | ICD-10-CM | POA: Diagnosis not present

## 2023-07-26 DIAGNOSIS — M531 Cervicobrachial syndrome: Secondary | ICD-10-CM | POA: Diagnosis not present

## 2023-07-28 DIAGNOSIS — M531 Cervicobrachial syndrome: Secondary | ICD-10-CM | POA: Diagnosis not present

## 2023-07-28 DIAGNOSIS — M25511 Pain in right shoulder: Secondary | ICD-10-CM | POA: Diagnosis not present

## 2023-07-28 DIAGNOSIS — M9901 Segmental and somatic dysfunction of cervical region: Secondary | ICD-10-CM | POA: Diagnosis not present

## 2023-07-28 DIAGNOSIS — M546 Pain in thoracic spine: Secondary | ICD-10-CM | POA: Diagnosis not present

## 2023-08-02 DIAGNOSIS — M25511 Pain in right shoulder: Secondary | ICD-10-CM | POA: Diagnosis not present

## 2023-08-02 DIAGNOSIS — M9901 Segmental and somatic dysfunction of cervical region: Secondary | ICD-10-CM | POA: Diagnosis not present

## 2023-08-02 DIAGNOSIS — M531 Cervicobrachial syndrome: Secondary | ICD-10-CM | POA: Diagnosis not present

## 2023-08-02 DIAGNOSIS — M546 Pain in thoracic spine: Secondary | ICD-10-CM | POA: Diagnosis not present

## 2023-08-03 ENCOUNTER — Ambulatory Visit: Payer: BC Managed Care – PPO

## 2023-08-09 DIAGNOSIS — M531 Cervicobrachial syndrome: Secondary | ICD-10-CM | POA: Diagnosis not present

## 2023-08-09 DIAGNOSIS — M9901 Segmental and somatic dysfunction of cervical region: Secondary | ICD-10-CM | POA: Diagnosis not present

## 2023-08-09 DIAGNOSIS — M25511 Pain in right shoulder: Secondary | ICD-10-CM | POA: Diagnosis not present

## 2023-08-09 DIAGNOSIS — M546 Pain in thoracic spine: Secondary | ICD-10-CM | POA: Diagnosis not present

## 2023-08-10 ENCOUNTER — Ambulatory Visit: Payer: BC Managed Care – PPO

## 2023-08-16 ENCOUNTER — Ambulatory Visit (INDEPENDENT_AMBULATORY_CARE_PROVIDER_SITE_OTHER)

## 2023-08-16 VITALS — BP 112/75 | HR 63 | Temp 97.8°F | Resp 18 | Ht 76.0 in | Wt 270.6 lb

## 2023-08-16 DIAGNOSIS — M25511 Pain in right shoulder: Secondary | ICD-10-CM | POA: Diagnosis not present

## 2023-08-16 DIAGNOSIS — M546 Pain in thoracic spine: Secondary | ICD-10-CM | POA: Diagnosis not present

## 2023-08-16 DIAGNOSIS — I888 Other nonspecific lymphadenitis: Secondary | ICD-10-CM

## 2023-08-16 DIAGNOSIS — D861 Sarcoidosis of lymph nodes: Secondary | ICD-10-CM | POA: Diagnosis not present

## 2023-08-16 DIAGNOSIS — Z79899 Other long term (current) drug therapy: Secondary | ICD-10-CM | POA: Diagnosis not present

## 2023-08-16 DIAGNOSIS — D863 Sarcoidosis of skin: Secondary | ICD-10-CM

## 2023-08-16 DIAGNOSIS — M9901 Segmental and somatic dysfunction of cervical region: Secondary | ICD-10-CM | POA: Diagnosis not present

## 2023-08-16 DIAGNOSIS — M531 Cervicobrachial syndrome: Secondary | ICD-10-CM | POA: Diagnosis not present

## 2023-08-16 MED ORDER — SODIUM CHLORIDE 0.9 % IV SOLN
3.0000 mg/kg | Freq: Once | INTRAVENOUS | Status: AC
  Administered 2023-08-16: 400 mg via INTRAVENOUS
  Filled 2023-08-16: qty 40

## 2023-08-16 MED ORDER — DIPHENHYDRAMINE HCL 25 MG PO CAPS: 25.0000 mg | ORAL_CAPSULE | Freq: Once | ORAL | Status: DC

## 2023-08-16 MED ORDER — ACETAMINOPHEN 325 MG PO TABS
650.0000 mg | ORAL_TABLET | Freq: Once | ORAL | Status: DC
Start: 2023-08-16 — End: 2023-08-16

## 2023-08-16 NOTE — Progress Notes (Cosign Needed)
 Diagnosis: Cutaneous sarcoidosis   Provider:  Phyllis Breeze MD  Procedure: IV Infusion  IV Type: Peripheral, IV Location: L Hand  Avsola  (infliximab -axxq), Dose: 400 mg  Infusion Start Time: 1417  Infusion Stop Time: 1630  Post Infusion IV Care: Peripheral IV Discontinued  Discharge: Condition: Good, Destination: Home . AVS Declined  Performed by:  Lauran Pollard, LPN   Patient refused pre-medications. Nurse educated patient and stressed the importance of taking pre-medications as a precaution in the event of a medication reaction. Patient verbalized understanding.

## 2023-08-30 DIAGNOSIS — M531 Cervicobrachial syndrome: Secondary | ICD-10-CM | POA: Diagnosis not present

## 2023-08-30 DIAGNOSIS — M25511 Pain in right shoulder: Secondary | ICD-10-CM | POA: Diagnosis not present

## 2023-08-30 DIAGNOSIS — M9901 Segmental and somatic dysfunction of cervical region: Secondary | ICD-10-CM | POA: Diagnosis not present

## 2023-08-30 DIAGNOSIS — M546 Pain in thoracic spine: Secondary | ICD-10-CM | POA: Diagnosis not present

## 2023-10-04 NOTE — Progress Notes (Unsigned)
 Office Visit Note  Patient: Eduardo Parker             Date of Birth: 1981/02/02           MRN: [id number]             PCP: Alvia Bring, DO Referring: Alvia Bring, DO Visit Date: 10/17/2023   Subjective:  Follow-up (Joint pain for the past two weeks )   History of Present Illness: Eduardo Parker is a 43 y.o. 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     Joint pains 2 wks Right 2nd mcp Left elbow Redness and pain  B/l knees Ankles Trace swelling  Left leg tatoo raised Back rashes raised  Go up to 5 mg/kg? Friday ifnusion ***  Previous HPI 07/17/2023 Eduardo Parker is a 43 y.o. 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   He has been on Avsola  since January to manage his sarcoidosis. Since starting the treatment, his skin lesions, which were previously indurated and nodular, have flattened significantly, although they remain marked. The lesions are no longer raised, and there is no induration. He notes a significant improvement in his condition over the past couple of months.   His lymph nodes, which were previously enlarged, are now less noticeable. They are no longer visible from a distance, although he can still feel them. He has mediastinal adenopathy and skin involvement with sarcoidosis. No coronary symptoms are present.   He reports no adverse reactions to the Avsola  infusions, such as swelling, and has not noticed any significant changes or side effects from the infusions.   He mentions a recent cold that lingered longer than expected and a finger injury that took longer to heal, which he attributes to the medication. The finger was described  as puffed up and required him to pop it and apply Neosporin for healing.   He has a history of high calcium  levels and elevated ACE levels, which were previously noted. He has tried other oral medications, including Cellcept  and azathioprine , but they did not significantly impact his condition. Methotrexate was not tried due to his alcohol use. Previous eye exam with Dr. Maree negative for uveitis prior to treatment.     Previous HPI 04/10/2023 Eduardo Parker is a 43 y.o. 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  He tried increasing mycophenolate  to 1000 mg twice daily but despite taking this for the first month he saw no difference at all and rashes.  So he discontinued the medication.  Otherwise has not noticed any major change in symptoms.  Due to the genital rash he had STI screening in primary care office that was negative.  1 new lesion from a abrasion on his ankle that he noticed has been very slow to heal present for the past 2 weeks.   Previous HPI 02/07/2023 DEMORIO Parker is a 43 y.o. 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  He started taking the mycophenolate  500 mg twice daily for about 2 months so far has not seen any major difference in symptoms also no trouble taking the medicine.  Still with active rashes  throughout his whole back.  No new swollen joints has been suffering some pain and soreness in his neck for at least the past 1 week.  He is not sure if it is related to the large lymph nodes. He is also noticed a new small erythematous rash on the edge of his glans.  No other lesions in his genital area and no palpable adenopathy.  Not associated with any dysuria, frequency, hematuria, or discharge.   Previous HPI 12/07/2022 Eduardo Parker is a 43 y.o. male here for follow up for sarcoidosis with cutaneous and cervical and  mediastinal lymphadenopathy involvement.  He started on Imuran  has been taking 100 mg daily now for about 2 months.  So far has not seen any significant improvement in symptoms.  Not noticed any side effect or problem with the medication either.  Still has extensive rashes throughout his back and palpable nontender nodules around the neck.   Previous HPI 08/25/22 Eduardo Parker is a 43 y.o. male here for sarcoidosis.  He was originally diagnosed with sarcoidosis after hospitalization for acute kidney injury with hypercalcemia and diffuse lymphadenopathy with granulomatous lymphadenitis findings on biopsy in July 2020.  Was also found to have some papular dermatitis involvement as well as left I inflammation with decreased visual acuity.  He was treated with a course of oral steroids with improvement of his lymphadenopathy symptoms. He saw Dr. Maree with findings indicative for inflammatory eye disease and was started on treatment with methotrexate but looks like he did not stay on the medication longer than 2 months.  He reports seeing no appreciable difference in symptoms when taking the medication.  He is pretty much had persistent painless lymph node swelling in multiple areas and decreased visual acuity in the left eye.  In the past few months skin rash has become more extensive now covering most of his back he was prescribed topical steroids for use but does not find is feasible over the large surface area have not seen a great response with the use so far.  He reports never having good exercise tolerance going back years but no particular cough or worsened dyspnea on exertion.  No joint pain or swelling.  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   HBV/HCV neg 04/2019   Review of Systems  Constitutional:  Negative for fatigue.  HENT:  Positive for mouth dryness. Negative for mouth sores.   Eyes:  Negative for dryness.   Respiratory:  Negative for shortness of breath.   Cardiovascular:  Negative for chest pain and palpitations.  Gastrointestinal:  Negative for blood in stool, constipation and diarrhea.  Endocrine: Negative for increased urination.  Genitourinary:  Negative for involuntary urination.  Musculoskeletal:  Positive for joint pain, joint pain, joint swelling, muscle weakness, morning stiffness and muscle tenderness. Negative for gait problem, myalgias and myalgias.  Skin:  Negative for color change, rash, hair loss and sensitivity to sunlight.  Allergic/Immunologic: Negative for susceptible to infections.  Neurological:  Negative for dizziness and headaches.  Hematological:  Negative for swollen glands.  Psychiatric/Behavioral:  Positive for depressed mood. Negative for sleep disturbance. The patient is not nervous/anxious.     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   Sarcoidosis of lymph nodes 11/15/2018   Constipation 11/11/2018   Family history of colon cancer 11/11/2018   Hypercalcemia 11/09/2018   Splenomegaly 11/09/2018   Mass of left side of neck 07/22/2018   Cervical lymphadenopathy 07/22/2018   Gastroesophageal reflux disease with esophagitis 07/12/2018   Deviated nasal septum 05/30/2017   Environmental allergies 05/30/2017   Dysthymia 05/30/2017   Elevated blood pressure reading 05/30/2017   Alcohol induced fatty liver 05/22/2017   Thiamine  deficiency 05/22/2017   Elevated ALT measurement 08/23/2016   Low libido 08/22/2016    Hypertriglyceridemia without hypercholesterolemia 04/29/2015   Right groin mass 04/28/2015   Generalized anxiety disorder 03/24/2015    Past Medical History:  Diagnosis Date   Alcohol induced fatty liver 05/22/2017   Anxiety    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   Hypertriglyceridemia without hypercholesterolemia 04/29/2015   Type 2 diabetes mellitus with hyperglycemia, without long-term current use of insulin (HCC) 04/22/2020    Family History  Problem Relation Age of Onset   Breast cancer Mother    Depression Mother    Depression Father    Bipolar disorder Father    Colon cancer Paternal Grandfather    Past Surgical History:  Procedure Laterality Date   LYMPH NODE BIOPSY  2020   VASECTOMY     Social History   Social History Narrative   Not on file   Immunization History  Administered Date(s) Administered   Pneumococcal Polysaccharide-23 05/04/2020   Tdap 11/12/2020     Objective: Vital Signs: BP 125/71 (BP Location: Left Arm, Patient Position: Sitting, Cuff Size: Normal)   Pulse 70   Resp 16   Ht 6' 4 (1.93 m)   Wt 268 lb (121.6 kg)   BMI 32.62 kg/m    Physical Exam   Musculoskeletal Exam: ***  CDAI Exam: CDAI Score: -- Patient Global: --; Provider Global: -- Swollen: --; Tender: -- Joint Exam 10/17/2023   No joint exam has been documented for this visit   There is currently no information documented on the homunculus. Go to the Rheumatology activity and complete the homunculus joint exam.  Investigation: No additional findings.  Imaging: No results found.  Recent Labs: Lab Results  Component Value Date   WBC 5.5 07/17/2023   HGB 16.4 07/17/2023   PLT 212 07/17/2023   NA 138 07/17/2023   K 3.6 07/17/2023   CL 104 07/17/2023   CO2 26 07/17/2023   GLUCOSE 137 (H) 07/17/2023   BUN 16 07/17/2023   CREATININE 1.09 07/17/2023   BILITOT 0.8 07/17/2023   ALKPHOS 49 05/18/2023   AST 36 07/17/2023   ALT 58 (H) 07/17/2023   PROT 7.1  07/17/2023   ALBUMIN 4.8 05/18/2023   CALCIUM  9.3 07/17/2023   GFRAA 76 04/21/2020   QFTBGOLDPLUS NEGATIVE 08/25/2022    Speciality Comments: No specialty comments available.  Procedures:  No procedures performed Allergies: Penicillins   Assessment / Plan:     Visit Diagnoses: Sarcoidosis of lymph nodes  High risk medication use - Avsola  infusions 3 mg/kg q8wks,  azathioprine  at just 50 mg once daily  ***  Orders: No orders of the defined types were placed in this encounter.  No orders of the defined types were placed in this encounter.    Follow-Up Instructions: No follow-ups on  file.   Lonni LELON Ester, MD  Note - This record has been created using Dragon software.  Chart creation errors have been sought, but may not always  have been located. Such creation errors do not reflect on  the standard of medical care.

## 2023-10-09 ENCOUNTER — Ambulatory Visit (INDEPENDENT_AMBULATORY_CARE_PROVIDER_SITE_OTHER)

## 2023-10-09 VITALS — BP 146/74 | HR 73

## 2023-10-09 DIAGNOSIS — I10 Essential (primary) hypertension: Secondary | ICD-10-CM

## 2023-10-09 NOTE — Progress Notes (Signed)
   Established Patient Office Visit  Subjective   Patient ID: Eduardo Parker, male    DOB: 1981-04-04  Age: 43 y.o. MRN: [id number]  No chief complaint on file.   HPI  THOMOS DOMINE is here for blood pressure check. He reports has mistakenly tossed out the lisinopril -hydrochlorothiazide . Denies chest pain, shortness of breath or dizziness.   ROS    Objective:     BP (!) 146/74   Pulse 73   SpO2 99%    Physical Exam   No results found for any visits on 10/09/23.    The ASCVD Risk score (Arnett DK, et al., 2019) failed to calculate for the following reasons:   The valid total cholesterol range is 130 to 320 mg/dL    Assessment & Plan:  HTN - Patient advised to continue to stay off the lisinopril -hydrochlorothiazide . Follow up in 3 months.    Problem List Items Addressed This Visit       Unprioritized   Essential hypertension - Primary    Return in about 3 months (around 01/09/2024) for blood pressure check with Dr Augustus Ledger. Arleen Bells, Aneta Bar, CMA

## 2023-10-11 ENCOUNTER — Other Ambulatory Visit: Payer: Self-pay | Admitting: Family Medicine

## 2023-10-11 ENCOUNTER — Ambulatory Visit

## 2023-10-11 DIAGNOSIS — F411 Generalized anxiety disorder: Secondary | ICD-10-CM

## 2023-10-11 MED ORDER — DIPHENHYDRAMINE HCL 25 MG PO CAPS: 25.0000 mg | ORAL_CAPSULE | Freq: Once | ORAL | Status: DC

## 2023-10-11 MED ORDER — ACETAMINOPHEN 325 MG PO TABS
650.0000 mg | ORAL_TABLET | Freq: Once | ORAL | Status: DC
Start: 2023-10-11 — End: 2023-10-11

## 2023-10-17 ENCOUNTER — Ambulatory Visit: Attending: Internal Medicine | Admitting: Internal Medicine

## 2023-10-17 ENCOUNTER — Encounter: Payer: Self-pay | Admitting: Internal Medicine

## 2023-10-17 VITALS — BP 125/71 | HR 70 | Resp 16 | Ht 76.0 in | Wt 268.0 lb

## 2023-10-17 DIAGNOSIS — D861 Sarcoidosis of lymph nodes: Secondary | ICD-10-CM | POA: Diagnosis not present

## 2023-10-17 DIAGNOSIS — Z79899 Other long term (current) drug therapy: Secondary | ICD-10-CM | POA: Diagnosis not present

## 2023-10-18 ENCOUNTER — Encounter: Payer: Self-pay | Admitting: Internal Medicine

## 2023-10-18 ENCOUNTER — Other Ambulatory Visit: Payer: Self-pay | Admitting: Pharmacist

## 2023-10-18 DIAGNOSIS — Z111 Encounter for screening for respiratory tuberculosis: Secondary | ICD-10-CM | POA: Insufficient documentation

## 2023-10-18 MED ORDER — AZATHIOPRINE 50 MG PO TABS: 50.0000 mg | ORAL_TABLET | Freq: Every day | ORAL | 0 refills | Status: DC

## 2023-10-18 NOTE — Progress Notes (Signed)
 D/w Dr. Jeannetta - patient's sarcoidosis is flaring. We will increase his Avsola  dose to 5mg /kg every 8 weeks.  Therapy plan addended for [facility]  His next scheduled infusion is 10/20/2023  Ziza Hastings, PharmD, MPH, BCPS, CPP Clinical Pharmacist (Rheumatology and Pulmonology)

## 2023-10-18 NOTE — Addendum Note (Signed)
 Addended by: DAYNE SHERRY RAMAN on: 10/18/2023 08:05 AM   Modules accepted: Orders

## 2023-10-19 LAB — CBC WITH DIFFERENTIAL/PLATELET
Absolute Lymphocytes: 538 {cells}/uL — ABNORMAL LOW (ref 850–3900)
Absolute Monocytes: 527 {cells}/uL (ref 200–950)
Basophils Absolute: 39 {cells}/uL (ref 0–200)
Basophils Relative: 1 %
Eosinophils Absolute: 140 {cells}/uL (ref 15–500)
Eosinophils Relative: 3.6 %
HCT: 42.6 % (ref 38.5–50.0)
Hemoglobin: 14.3 g/dL (ref 13.2–17.1)
MCH: 34.1 pg — ABNORMAL HIGH (ref 27.0–33.0)
MCHC: 33.6 g/dL (ref 32.0–36.0)
MCV: 101.7 fL — ABNORMAL HIGH (ref 80.0–100.0)
MPV: 10.1 fL (ref 7.5–12.5)
Monocytes Relative: 13.5 %
Neutro Abs: 2656 {cells}/uL (ref 1500–7800)
Neutrophils Relative %: 68.1 %
Platelets: 222 10*3/uL (ref 140–400)
RBC: 4.19 10*6/uL — ABNORMAL LOW (ref 4.20–5.80)
RDW: 12.1 % (ref 11.0–15.0)
Total Lymphocyte: 13.8 %
WBC: 3.9 10*3/uL (ref 3.8–10.8)

## 2023-10-19 LAB — COMPREHENSIVE METABOLIC PANEL WITH GFR
AG Ratio: 1.6 (calc) (ref 1.0–2.5)
ALT: 57 U/L — ABNORMAL HIGH (ref 9–46)
AST: 36 U/L (ref 10–40)
Albumin: 4.6 g/dL (ref 3.6–5.1)
Alkaline phosphatase (APISO): 65 U/L (ref 36–130)
BUN: 15 mg/dL (ref 7–25)
CO2: 25 mmol/L (ref 20–32)
Calcium: 9.9 mg/dL (ref 8.6–10.3)
Chloride: 102 mmol/L (ref 98–110)
Creat: 1.15 mg/dL (ref 0.60–1.29)
Globulin: 2.8 g/dL (ref 1.9–3.7)
Glucose, Bld: 86 mg/dL (ref 65–99)
Potassium: 4 mmol/L (ref 3.5–5.3)
Sodium: 137 mmol/L (ref 135–146)
Total Bilirubin: 1.4 mg/dL — ABNORMAL HIGH (ref 0.2–1.2)
Total Protein: 7.4 g/dL (ref 6.1–8.1)
eGFR: 80 mL/min/{1.73_m2} (ref >=60)

## 2023-10-19 LAB — QUANTIFERON-TB GOLD PLUS
Mitogen-NIL: 3.59 [IU]/mL
NIL: 0.04 [IU]/mL
QuantiFERON-TB Gold Plus: NEGATIVE
TB1-NIL: 0 [IU]/mL
TB2-NIL: 0 [IU]/mL

## 2023-10-19 LAB — ANGIOTENSIN CONVERTING ENZYME: Angiotensin-Converting Enzyme: 74 U/L — ABNORMAL HIGH (ref 9–67)

## 2023-10-20 ENCOUNTER — Ambulatory Visit (INDEPENDENT_AMBULATORY_CARE_PROVIDER_SITE_OTHER)

## 2023-10-20 VITALS — BP 130/78 | HR 62 | Temp 98.0°F | Resp 18 | Ht 76.0 in | Wt 266.2 lb

## 2023-10-20 DIAGNOSIS — Z111 Encounter for screening for respiratory tuberculosis: Secondary | ICD-10-CM

## 2023-10-20 DIAGNOSIS — D861 Sarcoidosis of lymph nodes: Secondary | ICD-10-CM | POA: Diagnosis not present

## 2023-10-20 DIAGNOSIS — I888 Other nonspecific lymphadenitis: Secondary | ICD-10-CM

## 2023-10-20 DIAGNOSIS — D863 Sarcoidosis of skin: Secondary | ICD-10-CM

## 2023-10-20 DIAGNOSIS — Z79899 Other long term (current) drug therapy: Secondary | ICD-10-CM | POA: Diagnosis not present

## 2023-10-20 MED ORDER — DIPHENHYDRAMINE HCL 25 MG PO CAPS
25.0000 mg | ORAL_CAPSULE | Freq: Once | ORAL | Status: DC
Start: 2023-10-20 — End: 2023-10-20
  Filled 2023-10-20: qty 1

## 2023-10-20 MED ORDER — SODIUM CHLORIDE 0.9 % IV SOLN
5.0000 mg/kg | Freq: Once | INTRAVENOUS | Status: AC
  Administered 2023-10-20: 600 mg via INTRAVENOUS
  Filled 2023-10-20: qty 60

## 2023-10-20 MED ORDER — ACETAMINOPHEN 325 MG PO TABS
650.0000 mg | ORAL_TABLET | Freq: Once | ORAL | Status: DC
  Filled 2023-10-20: qty 2

## 2023-10-20 NOTE — Progress Notes (Signed)
 Diagnosis: Cutaneous Sarcoidosis  Provider:  Lonna Coder MD  Procedure: IV Infusion  IV Type: Peripheral, IV Location: L Hand  Avsola  (infliximab -axxq), Dose: 600 mg  Infusion Start Time: 1416  Infusion Stop Time: 1629  Post Infusion IV Care: Peripheral IV Discontinued  Discharge: Condition: Good, Destination: Home . AVS Declined  Performed by:  Ashby Beery, RN   Patient refused pre-medications. Nurse educated patient and stressed the importance of taking pre-medications as a precaution in the event of a medication reaction. Patient verbalized understanding.

## 2023-11-03 ENCOUNTER — Telehealth: Payer: Self-pay

## 2023-11-03 NOTE — Telephone Encounter (Signed)
 Auth Submission: APPROVED - dose change Site of care: Site of care: [facility] Payer: [employer] commercial Medication & CPT/J Code(s) submitted: Avsola  (infliximab -axxq) V4878 Diagnosis Code:  Route of submission (phone, fax, portal): fax   Phone # Fax # [phone]  Auth type: Buy/Bill PB Units/visits requested: 5mg /kg every weeks x 6 doses Reference number: [id number] Approval from: 11/03/23 to 11/02/24

## 2023-11-28 ENCOUNTER — Telehealth: Payer: Self-pay

## 2023-11-28 DIAGNOSIS — D863 Sarcoidosis of skin: Secondary | ICD-10-CM

## 2023-11-28 DIAGNOSIS — I888 Other nonspecific lymphadenitis: Secondary | ICD-10-CM

## 2023-11-28 DIAGNOSIS — D861 Sarcoidosis of lymph nodes: Secondary | ICD-10-CM

## 2023-11-28 MED ORDER — PREDNISONE 10 MG PO TABS: ORAL_TABLET | ORAL | 0 refills | Status: AC

## 2023-11-28 NOTE — Telephone Encounter (Signed)
 Patient advised Dr. Jeannetta is sending a prednisone  prescription for the next 2 weeks to his [facility] pharmacy.  This should treat the current sarcoidosis flareup.  He should call back or message us  again if symptoms do not improve after a few days taking this.  We have a follow-up scheduled for next month Dr. Jeannetta would plan to address his infusion treatment again at that time, we might have to increase the dose or shorten the time between doses if symptoms keep coming back. Patient verbalized understanding.   Patient states he does not like taking prednisone . Patient states he will think about it but he may not even try the prednisone .

## 2023-11-28 NOTE — Addendum Note (Signed)
 Addended by: JEANNETTA LONNI ORN on: 11/28/2023 12:45 PM   Modules accepted: Orders

## 2023-11-28 NOTE — Telephone Encounter (Signed)
 Patient contacted the office and states he is currently on an infusion, believes Avsola , and azathioprine  1 tablet daily. Patient states his last infusion was beginning of July and he is scheduled for on eat the end of August. Patient states he is currently having a sarcoidosis flare with pains all over. Patient states between the last two infusion he also had a minor flare. Patient inquires if it is possible the infusion is wearing off and not working as well anymore. Patient would like a call back. Please advise.

## 2023-11-28 NOTE — Telephone Encounter (Signed)
 I am sending a prednisone  prescription for the next 2 weeks to his [facility] pharmacy.  This should treat the current sarcoidosis flareup.  He should call back or message us  again if symptoms do not improve after a few days taking this.  We have a follow-up scheduled for next month I would plan to address his infusion treatment again at that time, we might have to increase the dose or shorten the time between doses if symptoms keep coming back.

## 2023-12-06 NOTE — Telephone Encounter (Signed)
 Patient contacted the office and states he is still having pain all day everyday. Patient states he was given a prednisone  taper and has had no relief. Patient states he has been in pretty severe pain. Patient states it is effecting his sleep. Patient states it is also interfering with all aspects of his life including his ADLs. Patient is is difficult to get dressed and also has trouble standing or walking some days. Patient states other days he has trouble moving his arms. Patient states he is taking the Imuran  50 mg po daily. Please advise.

## 2023-12-08 NOTE — Telephone Encounter (Signed)
 Patient contacted the office stating he called the office on Wednesday about ongoing pain he has had despite taking Prednisone . Patient states he has not heard anything about what he can do. Patient would like a call back to advise what he should do.

## 2023-12-11 ENCOUNTER — Ambulatory Visit: Attending: Internal Medicine | Admitting: Internal Medicine

## 2023-12-11 ENCOUNTER — Encounter: Payer: Self-pay | Admitting: Internal Medicine

## 2023-12-11 VITALS — BP 140/82 | HR 82 | Resp 16 | Ht 76.0 in | Wt 261.1 lb

## 2023-12-11 DIAGNOSIS — M0600 Rheumatoid arthritis without rheumatoid factor, unspecified site: Secondary | ICD-10-CM

## 2023-12-11 DIAGNOSIS — Z79899 Other long term (current) drug therapy: Secondary | ICD-10-CM | POA: Diagnosis not present

## 2023-12-11 DIAGNOSIS — Z9229 Personal history of other drug therapy: Secondary | ICD-10-CM | POA: Diagnosis not present

## 2023-12-11 DIAGNOSIS — M1990 Unspecified osteoarthritis, unspecified site: Secondary | ICD-10-CM

## 2023-12-11 DIAGNOSIS — D863 Sarcoidosis of skin: Secondary | ICD-10-CM | POA: Diagnosis not present

## 2023-12-11 NOTE — Telephone Encounter (Signed)
 I spoke with Eduardo Parker about coming by Monday morning to check on his symptoms and possibly repeating labs, please add an appointment/overbook for 11:20.

## 2023-12-11 NOTE — Progress Notes (Addendum)
 Office Visit Note  Patient: Eduardo Parker             Date of Birth: 1981-02-21           MRN: [id number]             PCP: Alvia Bring, DO Referring: Alvia Bring, DO Visit Date: 12/11/2023   Subjective:  Joint Pain and Muscle Pain   Discussed the use of AI scribe software for clinical note transcription with the patient, who gave verbal consent to proceed.  History of Present Illness   Eduardo Parker is a 43 year old male with sarcoidosis on Avsola  5 mg/kg every 8 weeks who presents with worsening joint pain and stiffness.  He experiences widespread joint pain and stiffness, initially starting in his feet, toes, and ankles, and progressing to his knees. He has difficulty standing and walking, often needing to 'push off of things' to stand and 'throw this leg with my hip' to walk. His knees are particularly affected, causing significant discomfort during movement.  There is swelling in his ankles and some swelling in his knees. Despite steroid treatment, there have been no significant changes in his skin condition over the past two weeks. His joints remain swollen, especially the right third knuckle, which he reports is sometimes difficult to close into a fist.  No recent illness or fever is noted, but he experiences neck pain and difficulty swallowing, possibly due to swelling. Muscle stiffness and tenderness are present, particularly in his shoulders, which are difficult to move, especially at night. The pain sometimes disrupts his sleep, though he often wakes up feeling better, only for the pain to return later in the day.  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 Shoulder pain is exacerbated by movements such as reaching across his body or lifting his arm, making tasks like moving a pillow or steering a car challenging.  His last infusion provided significant relief, but the effects wore off more quickly than expected. He has not experienced pain until the  end of June, and finds it unusual that his rashes have not worsened despite increased joint pain.  No constipation or muscle cramping is reported, which are common symptoms of hypercalcemia, a condition he has had before. He describes his joint pain as soreness rather than cramping.        Previous HPI 10/17/23 Eduardo Parker is a 43 y.o. male here for follow up for sarcoidosis with cutaneous and cervical and mediastinal lymphadenopathy involvement now on Avsola  infusion 3 mg/kg q8wks since January. Symptoms were doing well but have been worsening including new complaint of joint pain and swelling for a few weeks.   He has been experiencing joint pain and swelling for the past two weeks, affecting his knuckles, wrist, elbow, and feet around the toes. The pain and swelling resolve in one area and then appear in another. He describes difficulty moving his elbow, such as bringing a cup to his mouth.   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 He is not sure if he overused joints, although he has done similar work in the past without joint swelling.   He was due for an Avsola  infusion last week or the week before, which he missed. His joint pain began before missing the infusion. Additionally, his rash has worsened since the last  visit, which he associates with the joint swelling. He has not experienced joint pain or swelling prior to this recent episode.   Previous HPI 07/17/2023 Eduardo Parker is a 43 y.o. 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   He has been on Avsola  since January to manage his sarcoidosis. Since starting the treatment, his skin lesions, which were previously indurated and  nodular, have flattened significantly, although they remain marked. The lesions are no longer raised, and there is no induration. He notes a significant improvement in his condition over the past couple of months.   His lymph nodes, which were previously enlarged, are now less noticeable. They are no longer visible from a distance, although he can still feel them. He has mediastinal adenopathy and skin involvement with sarcoidosis. No coronary symptoms are present.   He reports no adverse reactions to the Avsola  infusions, such as swelling, and has not noticed any significant changes or side effects from the infusions.   He mentions a recent cold that lingered longer than expected and a finger injury that took longer to heal, which he attributes to the medication. The finger was described as puffed up and required him to pop it and apply Neosporin for healing.   He has a history of high calcium  levels and elevated ACE levels, which were previously noted. He has tried other oral medications, including Cellcept  and azathioprine , but they did not significantly impact his condition. Methotrexate was not tried due to his alcohol use. Previous eye exam with Dr. Maree negative for uveitis prior to treatment.     Previous HPI 04/10/2023 Eduardo Parker is a 43 y.o. 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  He tried increasing mycophenolate  to 1000 mg twice daily but despite taking this for the first month he saw no difference at all and rashes.  So he discontinued the medication.  Otherwise has not noticed any major change in symptoms.  Due to the genital rash he had STI screening in primary care office that was negative.  1 new lesion from a abrasion on his ankle that he noticed has been very slow to heal present for the past 2 weeks.   Previous HPI 02/07/2023 Eduardo Parker is a 43 y.o. male here for follow up for sarcoidosis with  cutaneous and cervical and mediastinal lymphadenopathy involvement.  He started taking the mycophenolate  500 mg twice daily for about 2 months so far has not seen any major difference in symptoms also no trouble taking the medicine.  Still with active rashes throughout his whole back.  No new swollen joints has been suffering some pain and soreness in his neck for at least the past 1 week.  He is not sure if it is related to the large lymph nodes. He is also noticed a new small erythematous rash on the edge of his glans.  No other lesions in his genital area and no palpable adenopathy.  Not associated with any dysuria, frequency, hematuria, or discharge.   Previous HPI 12/07/2022 Eduardo Parker is a 43 y.o. 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  He started on Imuran  has been taking 100 mg daily now for about 2 months.  So far has not seen any significant improvement in symptoms.  Not noticed any side effect or problem with the medication either.  Still has extensive rashes throughout his back and palpable nontender nodules around the neck.   Previous HPI 08/25/22 Eduardo Parker is a 43 y.o. male here for sarcoidosis.  He was originally diagnosed with sarcoidosis after hospitalization for acute kidney injury with hypercalcemia and diffuse lymphadenopathy with granulomatous lymphadenitis findings on biopsy in July 2020.  Was also found to have some papular dermatitis involvement as well as left I inflammation with decreased visual acuity.  He was treated with a course of oral steroids with improvement of his lymphadenopathy symptoms. He saw Dr. Maree with findings indicative for inflammatory eye disease and was started on treatment with methotrexate but looks like he did not stay on the medication longer than 2 months.  He reports seeing no appreciable difference in symptoms when taking the medication.  He is pretty much had  persistent painless lymph node swelling in multiple areas and decreased visual acuity in the left eye.  In the past few months skin rash has become more extensive now covering most of his back he was prescribed topical steroids for use but does not find is feasible over the large surface area have not seen a great response with the use so far.  He reports never having good exercise tolerance going back years but no particular cough or worsened dyspnea on exertion.  No joint pain or swelling.  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   HBV/HCV neg 04/2019    Review of Systems  Constitutional:  Negative for fatigue.  HENT:  Positive for mouth dryness. Negative for mouth sores.   Eyes:  Negative for dryness.  Respiratory:  Negative for shortness of breath.   Cardiovascular:  Negative for chest pain and palpitations.  Gastrointestinal:  Negative for blood in stool, constipation and diarrhea.  Endocrine: Negative for increased urination.  Genitourinary:  Negative for involuntary urination.  Musculoskeletal:  Positive for joint pain, joint pain, joint swelling, myalgias, muscle weakness, morning stiffness and myalgias. Negative for gait problem and muscle tenderness.  Skin:  Positive for rash. Negative for color change, hair loss and sensitivity to sunlight.  Allergic/Immunologic: Negative for susceptible to infections.  Neurological:  Negative for dizziness and headaches.  Hematological:  Positive for swollen glands.  Psychiatric/Behavioral:  Positive for depressed mood and sleep disturbance. The patient is nervous/anxious.     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   Sarcoidosis of lymph nodes 11/15/2018   Constipation 11/11/2018   Family history of colon cancer 11/11/2018   Hypercalcemia 11/09/2018   Splenomegaly 11/09/2018   Mass of left side of neck 07/22/2018   Cervical lymphadenopathy 07/22/2018   Gastroesophageal reflux disease with esophagitis 07/12/2018   Deviated nasal septum 05/30/2017   Environmental allergies 05/30/2017   Dysthymia 05/30/2017   Elevated blood pressure reading 05/30/2017   Alcohol induced  fatty liver 05/22/2017   Thiamine  deficiency 05/22/2017   Elevated ALT measurement 08/23/2016   Low libido 08/22/2016   Hypertriglyceridemia without hypercholesterolemia 04/29/2015   Right groin mass 04/28/2015   Generalized anxiety disorder 03/24/2015    Past Medical History:  Diagnosis Date   Alcohol induced fatty liver 05/22/2017   Anxiety    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   Hypertriglyceridemia without hypercholesterolemia 04/29/2015   Type 2 diabetes mellitus with hyperglycemia, without long-term current use of insulin (HCC) 04/22/2020    Family History  Problem Relation Age of Onset   Breast cancer Mother    Depression Mother    Depression Father    Bipolar disorder Father    Colon cancer Paternal Grandfather    Past Surgical History:  Procedure Laterality Date   LYMPH NODE BIOPSY  2020   VASECTOMY     Social History   Social History Narrative   Not on file   Immunization History  Administered Date(s) Administered   Pneumococcal Polysaccharide-23 05/04/2020   Tdap 11/12/2020     Objective: Vital Signs: BP (!) 140/82 (BP Location: Left Arm, Patient Position: Sitting, Cuff  Size: Normal)   Pulse 82   Resp 16   Ht 6' 4 (1.93 m)   Wt 261 lb 1.6 oz (118.4 kg)   BMI 31.78 kg/m    Physical Exam Eyes:     Conjunctiva/sclera: Conjunctivae normal.  Cardiovascular:     Rate and Rhythm: Normal rate and regular rhythm.  Pulmonary:     Effort: Pulmonary effort is normal.     Breath sounds: Normal breath sounds.  Musculoskeletal:     Right lower leg: No edema.     Left lower leg: No edema.  Lymphadenopathy:     Cervical: No cervical adenopathy.  Skin:    General: Skin is warm and dry.     Findings: Rash present.     Comments: Family erythematous patches throughout back but without induration or local tenderness to pressure  Neurological:     Mental Status: He is alert.  Psychiatric:        Mood and Affect: Mood normal.      Musculoskeletal Exam:  Right shoulder tenderness to pressure and faint anterior swelling palpable Left elbow swelling and tenderness to pressure, mild overlying erythema Wrists full ROM no tenderness or swelling Fingers full ROM, right third MCP joint synovitis with visible erythema and decreased flexion range of motion No paraspinal tenderness to palpation over upper and lower back Hip normal internal and external rotation without pain, no tenderness to lateral hip palpation Knees full ROM b/l tenderness and small swelling is present Ankles full ROM some swelling but no tenderness to pressure or movement  Investigation: No additional findings.  Imaging: No results found.  Recent Labs: Lab Results  Component Value Date   WBC 6.7 12/15/2023   HGB 14.3 12/15/2023   PLT 285 12/15/2023   NA 135 12/15/2023   K 3.7 12/15/2023   CL 103 12/15/2023   CO2 21 12/15/2023   GLUCOSE 153 (H) 12/15/2023   BUN 21 12/15/2023   CREATININE 1.09 12/15/2023   BILITOT 0.9 12/15/2023   ALKPHOS 49 05/18/2023   AST 28 12/15/2023   ALT 40 12/15/2023   PROT 7.1 12/15/2023   ALBUMIN 4.8 05/18/2023   CALCIUM  9.8 12/15/2023   GFRAA 76  04/21/2020   QFTBGOLDPLUS NEGATIVE 10/17/2023    Speciality Comments: No specialty comments available.  Procedures:  No procedures performed Allergies: Penicillins   Assessment / Plan:  Visit Diagnoses: Seronegative rheumatoid arthritis  Cutaneous sarcoidosis - Plan: Sedimentation rate, Angiotensin converting enzyme, CK- Plan: Rheumatoid factor, Cyclic citrul peptide antibody, IgG, CK, INFLIXIMAB  LEVEL,ADA FOR RHEUMATIC DZ, CANCELED: INFLIXIMAB  ADA, RHEUM Chronic inflammatory arthritis with exacerbation, affecting knees, ankles, and hands. Suspected rheumatoid arthritis-like symptoms due to sarcoidosis. Previous infliximab  infusion provided temporary relief, suggesting possible subtherapeutic dosing. Hypercalcemia considered as a potential contributor to muscle issues. - Checking rheumatoid factor and CCP given more prominent features of peripheral joint synovitis for possible overlap - Evaluate for hypercalcemia, ACE, sed rate for disease activity assessment. - Measure infliximab  trough levels and antidrug antibodies-if trough level is too low but without high antibodies can try decreasing dose or frequency of infliximab  otherwise would switch to alternate class or alternate TNF inhibitor - Oral prednisone  taper at higher starting dose was offered to bridge until upcoming infusion on Friday but he prefers to limit total steroid exposure and declines at this time  High risk medication use - Plan: CBC with Differential/Platelet, Comprehensive metabolic panel with GFR, INFLIXIMAB  LEVEL,ADA FOR RHEUMATIC DZ, CANCELED: Infliximab  (IFX) Conc+ IFX Ab, CANCELED: INFLIXIMAB  ADA, RHEUM - Rechecking CBC CMP for medication monitoring on the infliximab   Shoulder pain due to subacromial bursitis or rotator cuff inflammation Right shoulder pain consistent with subacromial bursitis or rotator cuff inflammation, likely exacerbated by inflammatory arthritis. - Monitor shoulder symptoms with arthritis  management.        Orders: Orders Placed This Encounter  Procedures   Sedimentation rate   CBC with Differential/Platelet   Comprehensive metabolic panel with GFR   Angiotensin converting enzyme   Rheumatoid factor   Cyclic citrul peptide antibody, IgG   CK   INFLIXIMAB  LEVEL,ADA FOR RHEUMATIC DZ   INFLIXIMAB  LEVEL,ADA FOR RHEUMATIC DZ   No orders of the defined types were placed in this encounter.    Follow-Up Instructions: No follow-ups on file.   Lonni LELON Ester, MD  Note - This record has been created using [employer].  Chart creation errors have been sought, but may not always  have been located. Such creation errors do not reflect on  the standard of medical care.

## 2023-12-14 ENCOUNTER — Telehealth: Payer: Self-pay

## 2023-12-14 NOTE — Telephone Encounter (Signed)
 Patient called regarding lab results from 12/11/2023. He has viewed them on MyChart and states he has an infusion coming up and did not know if any of the lab results would indicate any changes needed to be made to the infusion. He is aware that one test is still in process. Please advise. Thanks!

## 2023-12-15 ENCOUNTER — Other Ambulatory Visit: Payer: Self-pay

## 2023-12-15 ENCOUNTER — Ambulatory Visit

## 2023-12-15 VITALS — BP 117/77 | HR 72 | Temp 98.3°F | Resp 18 | Ht 76.0 in | Wt 259.4 lb

## 2023-12-15 DIAGNOSIS — D861 Sarcoidosis of lymph nodes: Secondary | ICD-10-CM

## 2023-12-15 DIAGNOSIS — Z79899 Other long term (current) drug therapy: Secondary | ICD-10-CM | POA: Diagnosis not present

## 2023-12-15 DIAGNOSIS — D863 Sarcoidosis of skin: Secondary | ICD-10-CM | POA: Diagnosis not present

## 2023-12-15 DIAGNOSIS — I888 Other nonspecific lymphadenitis: Secondary | ICD-10-CM | POA: Diagnosis not present

## 2023-12-15 DIAGNOSIS — Z111 Encounter for screening for respiratory tuberculosis: Secondary | ICD-10-CM

## 2023-12-15 MED ORDER — SODIUM CHLORIDE 0.9 % IV SOLN
5.0000 mg/kg | Freq: Once | INTRAVENOUS | Status: AC
  Administered 2023-12-15: 600 mg via INTRAVENOUS
  Filled 2023-12-15: qty 60

## 2023-12-15 MED ORDER — ACETAMINOPHEN 325 MG PO TABS: 650.0000 mg | ORAL_TABLET | Freq: Once | ORAL | Status: DC

## 2023-12-15 MED ORDER — DIPHENHYDRAMINE HCL 25 MG PO CAPS
25.0000 mg | ORAL_CAPSULE | Freq: Once | ORAL | Status: DC
Start: 2023-12-15 — End: 2023-12-15

## 2023-12-15 NOTE — Telephone Encounter (Signed)
 His sed rate was significantly increased so I do think the symptoms are related to his sarcoidosis being active. The previous prednisone  taper may have been insufficient. The drug level test is still pending, but I recommend he proceed with the infusion as planned today. If the levels do come back too low we can just move his next treatment to be earlier.

## 2023-12-15 NOTE — Progress Notes (Signed)
 Diagnosis: Sarcoidosis  Provider:  Mannam, Praveen MD  Procedure: IV Infusion  IV Type: Peripheral, IV Location: L Hand  Avsola  (infliximab -axxq), Dose: 600 mg  Infusion Start Time: 1421  Infusion Stop Time: 1635  Post Infusion IV Care: Patient declined observation and Peripheral IV Discontinued  Discharge: Condition: Stable, Destination: Home . AVS Declined  Performed by:  Rocky FORBES Sar, RN

## 2023-12-15 NOTE — Telephone Encounter (Signed)
 Patient advised his sed rate was significantly increased so Dr. Jeannetta does think the symptoms are related to his sarcoidosis being active. The previous prednisone  taper may have been insufficient. The drug level test is still pending, but Dr. Jeannetta recommends he proceed with the infusion as planned today. If the levels do come back too low we can just move his next treatment to be earlier.

## 2023-12-16 LAB — CBC WITH DIFFERENTIAL/PLATELET
Absolute Lymphocytes: 529 {cells}/uL — ABNORMAL LOW (ref 850–3900)
Absolute Monocytes: 369 {cells}/uL (ref 200–950)
Basophils Absolute: 40 {cells}/uL (ref 0–200)
Basophils Relative: 0.6 %
Eosinophils Absolute: 208 {cells}/uL (ref 15–500)
Eosinophils Relative: 3.1 %
HCT: 42.2 % (ref 38.5–50.0)
Hemoglobin: 14.3 g/dL (ref 13.2–17.1)
MCH: 33 pg (ref 27.0–33.0)
MCHC: 33.9 g/dL (ref 32.0–36.0)
MCV: 97.5 fL (ref 80.0–100.0)
MPV: 10.4 fL (ref 7.5–12.5)
Monocytes Relative: 5.5 %
Neutro Abs: 5554 {cells}/uL (ref 1500–7800)
Neutrophils Relative %: 82.9 %
Platelets: 285 Thousand/uL (ref 140–400)
RBC: 4.33 Million/uL (ref 4.20–5.80)
RDW: 12.3 % (ref 11.0–15.0)
Total Lymphocyte: 7.9 %
WBC: 6.7 Thousand/uL (ref 3.8–10.8)

## 2023-12-16 LAB — COMPREHENSIVE METABOLIC PANEL WITH GFR
AG Ratio: 1.4 (calc) (ref 1.0–2.5)
ALT: 40 U/L (ref 9–46)
AST: 28 U/L (ref 10–40)
Albumin: 4.2 g/dL (ref 3.6–5.1)
Alkaline phosphatase (APISO): 64 U/L (ref 36–130)
BUN: 21 mg/dL (ref 7–25)
CO2: 21 mmol/L (ref 20–32)
Calcium: 9.8 mg/dL (ref 8.6–10.3)
Chloride: 103 mmol/L (ref 98–110)
Creat: 1.09 mg/dL (ref 0.60–1.29)
Globulin: 2.9 g/dL (ref 1.9–3.7)
Glucose, Bld: 153 mg/dL — ABNORMAL HIGH (ref 65–99)
Potassium: 3.7 mmol/L (ref 3.5–5.3)
Sodium: 135 mmol/L (ref 135–146)
Total Bilirubin: 0.9 mg/dL (ref 0.2–1.2)
Total Protein: 7.1 g/dL (ref 6.1–8.1)
eGFR: 85 mL/min/1.73m2 (ref >=60)

## 2023-12-17 LAB — CBC WITH DIFFERENTIAL/PLATELET
Absolute Lymphocytes: 761 {cells}/uL — ABNORMAL LOW (ref 850–3900)
Absolute Monocytes: 592 {cells}/uL (ref 200–950)
Basophils Absolute: 59 {cells}/uL (ref 0–200)
Basophils Relative: 0.9 %
Eosinophils Absolute: 143 {cells}/uL (ref 15–500)
Eosinophils Relative: 2.2 %
HCT: 46.3 % (ref 38.5–50.0)
Hemoglobin: 15.6 g/dL (ref 13.2–17.1)
MCH: 33 pg (ref 27.0–33.0)
MCHC: 33.7 g/dL (ref 32.0–36.0)
MCV: 97.9 fL (ref 80.0–100.0)
MPV: 10.1 fL (ref 7.5–12.5)
Monocytes Relative: 9.1 %
Neutro Abs: 4947 {cells}/uL (ref 1500–7800)
Neutrophils Relative %: 76.1 %
Platelets: 294 Thousand/uL (ref 140–400)
RBC: 4.73 Million/uL (ref 4.20–5.80)
RDW: 12.4 % (ref 11.0–15.0)
Total Lymphocyte: 11.7 %
WBC: 6.5 Thousand/uL (ref 3.8–10.8)

## 2023-12-17 LAB — COMPREHENSIVE METABOLIC PANEL WITH GFR
AG Ratio: 1.6 (calc) (ref 1.0–2.5)
ALT: 57 U/L — ABNORMAL HIGH (ref 9–46)
AST: 36 U/L (ref 10–40)
Albumin: 4.4 g/dL (ref 3.6–5.1)
Alkaline phosphatase (APISO): 62 U/L (ref 36–130)
BUN: 16 mg/dL (ref 7–25)
CO2: 28 mmol/L (ref 20–32)
Calcium: 9.8 mg/dL (ref 8.6–10.3)
Chloride: 102 mmol/L (ref 98–110)
Creat: 0.97 mg/dL (ref 0.60–1.29)
Globulin: 2.7 g/dL (ref 1.9–3.7)
Glucose, Bld: 102 mg/dL — ABNORMAL HIGH (ref 65–99)
Potassium: 3.9 mmol/L (ref 3.5–5.3)
Sodium: 142 mmol/L (ref 135–146)
Total Bilirubin: 0.6 mg/dL (ref 0.2–1.2)
Total Protein: 7.1 g/dL (ref 6.1–8.1)
eGFR: 98 mL/min/1.73m2 (ref >=60)

## 2023-12-17 LAB — CK: Total CK: 75 U/L (ref 26–366)

## 2023-12-17 LAB — SEDIMENTATION RATE: Sed Rate: 46 mm/h — ABNORMAL HIGH (ref 0–15)

## 2023-12-17 LAB — INFLIXIMAB LEVEL,ADA FOR RHEUMATIC DZ
INFLIXIMAB ADA, RHEUM: >100 [AU] — ABNORMAL HIGH (ref <10)
INFLIXIMAB LEVEL, RHEUM: <0.8 ug/mL

## 2023-12-17 LAB — ANGIOTENSIN CONVERTING ENZYME: Angiotensin-Converting Enzyme: 29 U/L (ref 9–67)

## 2023-12-17 LAB — CYCLIC CITRUL PEPTIDE ANTIBODY, IGG: Cyclic Citrullin Peptide Ab: <16 U

## 2023-12-17 LAB — RHEUMATOID FACTOR: Rheumatoid fact SerPl-aCnc: 10 [IU]/mL (ref <14)

## 2023-12-18 ENCOUNTER — Ambulatory Visit: Payer: Self-pay | Admitting: Internal Medicine

## 2023-12-18 ENCOUNTER — Telehealth: Payer: Self-pay | Admitting: Pharmacist

## 2023-12-18 DIAGNOSIS — M1990 Unspecified osteoarthritis, unspecified site: Secondary | ICD-10-CM

## 2023-12-18 DIAGNOSIS — M0600 Rheumatoid arthritis without rheumatoid factor, unspecified site: Secondary | ICD-10-CM

## 2023-12-18 DIAGNOSIS — D863 Sarcoidosis of skin: Secondary | ICD-10-CM

## 2023-12-18 DIAGNOSIS — Z79899 Other long term (current) drug therapy: Secondary | ICD-10-CM

## 2023-12-18 NOTE — Progress Notes (Signed)
 I spoke with Eduardo Parker his results showed increased sed rate of 46 consistent with active inflammation as a cause for increased joint pain and swelling. His anti-infliximab  antibodies were highly positive and drug was undetectable so appears to have developed clinically significant neutralizing antibodies and we will have to change medication. Interestingly, ACE level remained low despite the increase in sed rate, maybe corresponding more to the joint inflammation whereas skin granulomatous activity remains low.

## 2023-12-18 NOTE — Telephone Encounter (Addendum)
 Patient with cutaneous sarcoidosis with lymph node involvement and now seronegative RA.  Patient has history of alcohol-induced fatty liver disease and active alcohol use above recommended amount for MTX and leflunomide. He is unable to take methotrexate. Is currently taking azathioprine   He has developed infliximab  anti-drug antibodies even at high dose. Last infusion was 12/15/2023  D/w Dr. Jeannetta. Would like to start Cimzia PFS with possibility for lower risk for antibody development.   Dose: 400mg  subcut at Week 0, Week 2, Week 4 then every 4 weeks thereafter  Dx: seronegative RA as primary (Dr. Jeannetta is adding to his OV note that he is working on completing)  Sherry Pennant, PharmD, MPH, BCPS, CPP Clinical Pharmacist (Rheumatology and Pulmonology)

## 2023-12-20 DIAGNOSIS — M0600 Rheumatoid arthritis without rheumatoid factor, unspecified site: Secondary | ICD-10-CM | POA: Insufficient documentation

## 2023-12-22 NOTE — Telephone Encounter (Signed)
 Submitted a Prior Authorization request to [facility] for CIMZIA via CoverMyMeds. Will update once we receive a response.  Key: [id number]

## 2023-12-26 ENCOUNTER — Other Ambulatory Visit (HOSPITAL_COMMUNITY): Payer: Self-pay

## 2023-12-26 ENCOUNTER — Other Ambulatory Visit: Payer: Self-pay

## 2023-12-27 ENCOUNTER — Encounter: Payer: Self-pay | Admitting: Internal Medicine

## 2023-12-27 ENCOUNTER — Other Ambulatory Visit (HOSPITAL_COMMUNITY): Payer: Self-pay

## 2023-12-27 MED ORDER — CIMZIA-STARTER 200 MG/ML ~~LOC~~ PSKT
PREFILLED_SYRINGE | SUBCUTANEOUS | 0 refills | Status: DC
  Filled 2024-01-08: qty 3, 28d supply, fill #0

## 2023-12-27 NOTE — Telephone Encounter (Signed)
 Received notification from [facility] regarding a prior authorization for CIMZIA . Authorization has been APPROVED from 12/22/2023 to 12/21/2024.   Per test claim, copay for 28 days supply is [phone]  Patient can fill through [facility]: [phone]   Authorization # [id number]  Enrolled patient into Cimzia  copay card ID: [id number] BIN: [id number] PCN: CN Group: [id number]  Test claim with copay card shows full coverage.  Patient is due next Avsola  infusion on 02/09/2024. D/w Dr. Jeannetta - can start Cimzia  four weeks after last infusion since he has developed anti-drug antibodies. He start on or after 01/12/2024  Dose is Week 0, 2, 4 then every 4 weeks  Patient scheduled for Cimzia  new start on 01/11/2024. Rx sent to [facility] for onboarding. Patient aware that [facility] will reach out to schedule shipment to office before appt.  Sherry Pennant, PharmD, MPH, BCPS, CPP Clinical Pharmacist (Rheumatology and Pulmonology)

## 2024-01-05 ENCOUNTER — Other Ambulatory Visit: Payer: Self-pay

## 2024-01-05 ENCOUNTER — Other Ambulatory Visit (HOSPITAL_COMMUNITY): Payer: Self-pay

## 2024-01-08 ENCOUNTER — Other Ambulatory Visit: Payer: Self-pay

## 2024-01-08 ENCOUNTER — Other Ambulatory Visit (HOSPITAL_COMMUNITY): Payer: Self-pay

## 2024-01-08 NOTE — Progress Notes (Signed)
 Specialty Pharmacy Initial Fill Coordination Note  Eduardo Parker is a 43 y.o. male contacted today regarding initial fill of specialty medication(s) Certolizumab Pegol  (Cimzia -Starter)   Patient requested Courier to Provider Office   Delivery date: 01/10/24   Verified address: [address] [place], [place] [address]   Medication will be filled on 01/09/2024.   Patient is enrolled into copay card program and is aware of $0 copayment.

## 2024-01-09 ENCOUNTER — Other Ambulatory Visit: Payer: Self-pay

## 2024-01-09 ENCOUNTER — Ambulatory Visit: Admitting: Family Medicine

## 2024-01-09 ENCOUNTER — Encounter: Payer: Self-pay | Admitting: Family Medicine

## 2024-01-09 VITALS — BP 133/81 | HR 69 | Ht 76.0 in | Wt 264.8 lb

## 2024-01-09 DIAGNOSIS — Z7985 Long-term (current) use of injectable non-insulin antidiabetic drugs: Secondary | ICD-10-CM

## 2024-01-09 DIAGNOSIS — D863 Sarcoidosis of skin: Secondary | ICD-10-CM | POA: Diagnosis not present

## 2024-01-09 DIAGNOSIS — F411 Generalized anxiety disorder: Secondary | ICD-10-CM | POA: Diagnosis not present

## 2024-01-09 DIAGNOSIS — E119 Type 2 diabetes mellitus without complications: Secondary | ICD-10-CM

## 2024-01-09 DIAGNOSIS — I10 Essential (primary) hypertension: Secondary | ICD-10-CM

## 2024-01-09 DIAGNOSIS — Z1211 Encounter for screening for malignant neoplasm of colon: Secondary | ICD-10-CM

## 2024-01-09 LAB — POCT GLYCOSYLATED HEMOGLOBIN (HGB A1C): HbA1c, POC (controlled diabetic range): 5 % (ref 0.0–7.0)

## 2024-01-09 LAB — POCT UA - MICROALBUMIN
Albumin/Creatinine Ratio, Urine, POC: <30
Creatinine, POC: 200 mg/dL
Microalbumin Ur, POC: 80 mg/L

## 2024-01-09 NOTE — Assessment & Plan Note (Signed)
 A1c 10.1% at time of diagnosis.  He has done well with making diet and lifestyle changes.  He is off all medications at this time and A1c today is 5.0%.SABRA

## 2024-01-09 NOTE — Progress Notes (Signed)
 Eduardo Parker - 43 y.o. male MRN [id number]  Date of birth: 1980/08/18  Subjective Chief Complaint  Patient presents with   Diabetes    Follow up visit. Patient states last DM eye exam was greater than 2 years ago . He will call and schedule this visit.  Patient states he is no longer taking the prescription for Ozempic . Foot exam was normal bilaterally.    HPI Eduardo Parker is a 43 y.o. male here today for follow up visit.   He reports that he is doing well  He has stopped BP medications.  BP is well controlled.  He has not had chest pain, shortness of breath, palpitations, headache or vision changes.  He has made changes to his diet and is more active.    He has history of diabetes.  He has been on Ozempic  for management of this in the past but is no longer taking.  His A1c today is 5.0%  Recently started on Cimzia  for management of his sarcoid.   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   Hypertriglyceridemia without hypercholesterolemia 04/29/2015   Type 2 diabetes mellitus with hyperglycemia, without long-term current use of insulin (HCC) 04/22/2020    Past Surgical History:  Procedure Laterality Date   LYMPH NODE BIOPSY  2020   VASECTOMY      Social History   Socioeconomic History   Marital status: Single    Spouse name: Not on file   Number of children: Not on file   Years of education: Not on file   Highest education level: Not on file  Occupational History   Not on file  Tobacco Use   Smoking status: Never    Passive exposure: Past   Smokeless tobacco: Never  Vaping Use   Vaping status: Never Used  Substance and Sexual Activity   Alcohol use: Yes    Alcohol/week: 50.0 standard drinks of alcohol    Types: 50  Standard drinks or equivalent per week    Comment: 8 drinks daily   Drug use: No   Sexual activity: Yes    Birth control/protection: None  Other Topics Concern   Not on file  Social History Narrative   Not on file   Social Drivers of Health   Financial Resource Strain: Not on file  Food Insecurity: Not on file  Transportation Needs: Not on file  Physical Activity: Not on file  Stress: Not on file  Social Connections: Unknown (08/24/2021)   Received from [facility]   Social Network    Social Network: Not on file    Family History  Problem Relation Age of Onset   Breast cancer Mother    Depression Mother    Depression Father    Bipolar disorder Father    Colon cancer Paternal Grandfather     Health Maintenance  Topic Date Due   OPHTHALMOLOGY EXAM  05/27/2021   Colonoscopy  Never done   COVID-19 Vaccine (1) 01/25/2024 (Originally 11/13/1983)   Influenza Vaccine  07/23/2024 (Originally 11/24/2023)   Pneumococcal Vaccine (2 of 2 - PCV) 01/08/2025 (Originally 05/04/2021)   Hepatitis B Vaccines 19-59 Average Risk (1 of 3 - 19+ 3-dose series) 01/08/2025 (Originally 11/12/1997)   HPV VACCINES (1 -  Risk 3-dose SCDM series) 01/08/2025 (Originally 11/12/2005)   HEMOGLOBIN A1C  07/08/2024   Diabetic kidney evaluation - eGFR measurement  12/14/2024   Diabetic kidney evaluation - Urine ACR  01/08/2025   FOOT EXAM  01/08/2025   DTaP/Tdap/Td (2 - Td or Tdap) 11/13/2030   Hepatitis C Screening  Completed   HIV Screening  Completed   Meningococcal B Vaccine  Aged Out     ----------------------------------------------------------------------------------------------------------------------------------------------------------------------------------------------------------------- Physical Exam BP 133/81   Pulse 69   Ht 6' 4 (1.93 m)   Wt 264 lb 12 oz (120.1 kg)   SpO2 98%   BMI 32.23 kg/m   Physical Exam Constitutional:      Appearance: Normal appearance.  Cardiovascular:      Rate and Rhythm: Normal rate and regular rhythm.  Pulmonary:     Effort: Pulmonary effort is normal.     Breath sounds: Normal breath sounds.  Neurological:     General: No focal deficit present.     Mental Status: He is alert.  Psychiatric:        Mood and Affect: Mood normal.        Behavior: Behavior normal.     ------------------------------------------------------------------------------------------------------------------------------------------------------------------------------------------------------------------- Assessment and Plan  Type 2 diabetes mellitus without complications (HCC) A1c 10.1% at time of diagnosis.  He has done well with making diet and lifestyle changes.  He is off all medications at this time and A1c today is 5.0%..   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  Recently started on cimzia   Essential hypertension Bp is well controlled off of medication at this time.  Encouraged to continue to monitor at home.   Generalized anxiety disorder Stable with effexor  at current strength.     No orders of the defined types were placed in this encounter.   Return in about 6 months (around 07/08/2024) for Annual Exam.

## 2024-01-09 NOTE — Assessment & Plan Note (Signed)
 Stable with effexor  at current strength.

## 2024-01-09 NOTE — Assessment & Plan Note (Signed)
 Bp is well controlled off of medication at this time.  Encouraged to continue to monitor at home.

## 2024-01-09 NOTE — Assessment & Plan Note (Signed)
 Followed by rheumatology.  Recently started on cimzia 

## 2024-01-11 ENCOUNTER — Ambulatory Visit: Attending: Internal Medicine | Admitting: Pharmacist

## 2024-01-11 ENCOUNTER — Other Ambulatory Visit: Payer: Self-pay

## 2024-01-11 DIAGNOSIS — Z7189 Other specified counseling: Secondary | ICD-10-CM

## 2024-01-11 DIAGNOSIS — M0600 Rheumatoid arthritis without rheumatoid factor, unspecified site: Secondary | ICD-10-CM

## 2024-01-11 DIAGNOSIS — Z79899 Other long term (current) drug therapy: Secondary | ICD-10-CM | POA: Diagnosis not present

## 2024-01-11 DIAGNOSIS — D863 Sarcoidosis of skin: Secondary | ICD-10-CM | POA: Diagnosis not present

## 2024-01-11 MED ORDER — CIMZIA (2 SYRINGE) 200 MG/ML ~~LOC~~ PSKT
400.0000 mg | PREFILLED_SYRINGE | SUBCUTANEOUS | 1 refills | Status: DC
  Filled 2024-01-11 – 2024-02-01 (×2): qty 1, 28d supply, fill #0

## 2024-01-11 NOTE — Progress Notes (Deleted)
 Office Visit Note  Patient: Eduardo Parker             Date of Birth: 06/20/1980           MRN: [id number]             PCP: Alvia Bring, DO Referring: Alvia Bring, DO Visit Date: 01/22/2024   Subjective:  No chief complaint on file.   History of Present Illness: Eduardo Parker is a 43 y.o. male here for follow up with sarcoidosis on Avsola  5 mg/kg every 8 weeks who presents with worsening joint pain and stiffness.   Previous HPI 12/11/2023 Eduardo Parker is a 43 year old male with sarcoidosis on Avsola  5 mg/kg every 8 weeks who presents with worsening joint pain and stiffness.   He experiences widespread joint pain and stiffness, initially starting in his feet, toes, and ankles, and progressing to his knees. He has difficulty standing and walking, often needing to 'push off of things' to stand and 'throw this leg with my hip' to walk. His knees are particularly affected, causing significant discomfort during movement.   There is swelling in his ankles and some swelling in his knees. Despite steroid treatment, there have been no significant changes in his skin condition over the past two weeks. His joints remain swollen, especially the right third knuckle, which he reports is sometimes difficult to close into a fist.   No recent illness or fever is noted, but he experiences neck pain and difficulty swallowing, possibly due to swelling. Muscle stiffness and tenderness are present, particularly in his shoulders, which are difficult to move, especially at night. The pain sometimes disrupts his sleep, though he often wakes up feeling better, only for the pain to return later in the day.   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 Shoulder pain is exacerbated by movements such as reaching across his body or lifting his arm, making tasks like moving a pillow or steering a car challenging.   His last infusion provided significant relief, but the effects wore off more  quickly than expected. He has not experienced pain until the end of June, and finds it unusual that his rashes have not worsened despite increased joint pain.   No constipation or muscle cramping is reported, which are common symptoms of hypercalcemia, a condition he has had before. He describes his joint pain as soreness rather than cramping.         Previous HPI 10/17/23 Eduardo Parker is a 43 y.o. male here for follow up for sarcoidosis with cutaneous and cervical and mediastinal lymphadenopathy involvement now on Avsola  infusion 3 mg/kg q8wks since January. Symptoms were doing well but have been worsening including new complaint of joint pain and swelling for a few weeks.   He has been experiencing joint pain and swelling for the past two weeks, affecting his knuckles, wrist, elbow, and feet around the toes. The pain and swelling resolve in one area and then appear in another. He describes difficulty moving his elbow, such as bringing a cup to his mouth.   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 He is not sure if he overused joints, although he has done similar work in the past without joint swelling.   He was due for an Avsola  infusion last week or the week before, which  he missed. His joint pain began before missing the infusion. Additionally, his rash has worsened since the last visit, which he associates with the joint swelling. He has not experienced joint pain or swelling prior to this recent episode.   Previous HPI 07/17/2023 Eduardo Parker is a 43 y.o. 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   He has been on Avsola  since January to manage his sarcoidosis. Since starting the  treatment, his skin lesions, which were previously indurated and nodular, have flattened significantly, although they remain marked. The lesions are no longer raised, and there is no induration. He notes a significant improvement in his condition over the past couple of months.   His lymph nodes, which were previously enlarged, are now less noticeable. They are no longer visible from a distance, although he can still feel them. He has mediastinal adenopathy and skin involvement with sarcoidosis. No coronary symptoms are present.   He reports no adverse reactions to the Avsola  infusions, such as swelling, and has not noticed any significant changes or side effects from the infusions.   He mentions a recent cold that lingered longer than expected and a finger injury that took longer to heal, which he attributes to the medication. The finger was described as puffed up and required him to pop it and apply Neosporin for healing.   He has a history of high calcium  levels and elevated ACE levels, which were previously noted. He has tried other oral medications, including Cellcept  and azathioprine , but they did not significantly impact his condition. Methotrexate was not tried due to his alcohol use. Previous eye exam with Dr. Maree negative for uveitis prior to treatment.     Previous HPI 04/10/2023 Eduardo Parker is a 43 y.o. 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  He tried increasing mycophenolate  to 1000 mg twice daily but despite taking this for the first month he saw no difference at all and rashes.  So he discontinued the medication.  Otherwise has not noticed any major change in symptoms.  Due to the genital rash he had STI screening in primary care office that was negative.  1 new lesion from a abrasion on his ankle that he noticed has been very slow to heal present for the past 2 weeks.   Previous HPI 02/07/2023 Eduardo OBRYAN is a 43 y.o. 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  He started taking the mycophenolate  500 mg twice daily for about 2 months so far has not seen any major difference in symptoms also no trouble taking the medicine.  Still with active rashes throughout his whole back.  No new swollen joints has been suffering some pain and soreness in his neck for at least the past 1 week.  He is not sure if it is related to the large lymph nodes. He is also noticed a new small erythematous rash on the edge of his glans.  No other lesions in his genital area and no palpable adenopathy.  Not associated with any dysuria, frequency, hematuria, or discharge.   Previous HPI 12/07/2022 KAILO KOSIK is a 43 y.o. 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  He started on Imuran  has been taking 100 mg  daily now for about 2 months.  So far has not seen any significant improvement in symptoms.  Not noticed any side effect or problem with the medication either.  Still has extensive rashes throughout his back and palpable nontender nodules around the neck.   Previous HPI 08/25/22 KEDAR SEDANO is a 43 y.o. male here for sarcoidosis.  He was originally diagnosed with sarcoidosis after hospitalization for acute kidney injury with hypercalcemia and diffuse lymphadenopathy with granulomatous lymphadenitis findings on biopsy in July 2020.  Was also found to have some papular dermatitis involvement as well as left I inflammation with decreased visual acuity.  He was treated with a course of oral steroids with improvement of his lymphadenopathy symptoms. He saw Dr. Maree with findings indicative for inflammatory eye disease and was started on treatment with methotrexate but looks like he did not stay on the medication longer than 2 months.  He reports seeing no appreciable difference in  symptoms when taking the medication.  He is pretty much had persistent painless lymph node swelling in multiple areas and decreased visual acuity in the left eye.  In the past few months skin rash has become more extensive now covering most of his back he was prescribed topical steroids for use but does not find is feasible over the large surface area have not seen a great response with the use so far.  He reports never having good exercise tolerance going back years but no particular cough or worsened dyspnea on exertion.  No joint pain or swelling.  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   HBV/HCV neg 04/2019     No Rheumatology ROS completed.   PMFS History:  Patient Active Problem List   Diagnosis Date Noted   Seronegative rheumatoid arthritis (HCC) 12/20/2023   Inflammatory arthritis 12/11/2023   History of infliximab  therapy 12/11/2023   Screening for tuberculosis 10/18/2023   Type 2 diabetes mellitus without complications (HCC) 06/22/2023   Penile lesion 02/28/2023   Rash of penis 02/07/2023   High risk medication use 12/07/2022   Finger numbness 06/17/2022   Vasectomy evaluation 11/12/2020   Cutaneous sarcoidosis 08/10/2020   Essential hypertension 10/25/2019   Alcohol use disorder, moderate, dependence (HCC) 09/11/2019   Retinal edema 06/27/2019   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   Sarcoidosis of lymph nodes 11/15/2018   Constipation 11/11/2018   Family history of colon cancer 11/11/2018   Hypercalcemia 11/09/2018   Splenomegaly 11/09/2018   Mass of left side of neck 07/22/2018   Cervical lymphadenopathy 07/22/2018   Gastroesophageal reflux disease with esophagitis 07/12/2018   Deviated nasal septum 05/30/2017   Environmental allergies 05/30/2017   Dysthymia 05/30/2017   Elevated blood pressure reading 05/30/2017   Alcohol induced fatty liver 05/22/2017   Thiamine  deficiency  05/22/2017   Elevated ALT measurement 08/23/2016   Low libido 08/22/2016   Hypertriglyceridemia without hypercholesterolemia 04/29/2015   Right groin mass 04/28/2015   Generalized anxiety disorder 03/24/2015    Past Medical History:  Diagnosis Date   Alcohol induced fatty liver 05/22/2017   Anxiety    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   Hypertriglyceridemia without hypercholesterolemia 04/29/2015   Type 2 diabetes mellitus with hyperglycemia, without long-term current use of insulin (HCC) 04/22/2020    Family History  Problem Relation Age of Onset   Breast cancer Mother    Depression Mother    Depression Father    Bipolar disorder Father  Colon cancer Paternal Grandfather    Past Surgical History:  Procedure Laterality Date   LYMPH NODE BIOPSY  2020   VASECTOMY     Social History   Social History Narrative   Not on file   Immunization History  Administered Date(s) Administered   Pneumococcal Polysaccharide-23 05/04/2020   Tdap 11/12/2020     Objective: Vital Signs: There were no vitals taken for this visit.   Physical Exam   Musculoskeletal Exam: ***  CDAI Exam: CDAI Score: -- Patient Global: --; Provider Global: -- Swollen: --; Tender: -- Joint Exam 01/22/2024   No joint exam has been documented for this visit   There is currently no information documented on the homunculus. Go to the Rheumatology activity and complete the homunculus joint exam.  Investigation: No additional findings.  Imaging: No results found.  Recent Labs: Lab Results  Component Value Date   WBC 6.7 12/15/2023   HGB 14.3 12/15/2023   PLT 285 12/15/2023   NA 135 12/15/2023   K 3.7 12/15/2023   CL 103 12/15/2023   CO2 21 12/15/2023   GLUCOSE 153 (H) 12/15/2023   BUN 21 12/15/2023   CREATININE 1.09 12/15/2023   BILITOT 0.9 12/15/2023   ALKPHOS 49 05/18/2023   AST 28 12/15/2023   ALT 40 12/15/2023   PROT 7.1 12/15/2023   ALBUMIN 4.8 05/18/2023   CALCIUM  9.8  12/15/2023   GFRAA 76 04/21/2020   QFTBGOLDPLUS NEGATIVE 10/17/2023    Speciality Comments: No specialty comments available.  Procedures:  No procedures performed Allergies: Penicillins   Assessment / Plan:     Visit Diagnoses: No diagnosis found.  ***  Orders: No orders of the defined types were placed in this encounter.  No orders of the defined types were placed in this encounter.    Follow-Up Instructions: No follow-ups on file.   Ercie Eliasen M Loni Abdon, CMA  Note - This record has been created using Animal nutritionist.  Chart creation errors have been sought, but may not always  have been located. Such creation errors do not reflect on  the standard of medical care.

## 2024-01-11 NOTE — Patient Instructions (Addendum)
 Your next CIMZIA  dose is due on 01/25/24, 02/08/24 then every 4 weeks thereafter  DISCONTINUE azathioprine  (confirmed with Dr. Jeannetta)  HOLD CIMZIA  if you have signs or symptoms of an infection. You can resume once you feel better or back to your baseline. HOLD CIMZIA  if you start antibiotics to treat an infection. HOLD CIMZIA  around the time of surgery/procedures. Your surgeon will be able to provide recommendations on when to hold BEFORE and when you are cleared to RESUME.  Pharmacy information: Your prescription will be shipped from [facility]. Their phone number is [phone] They will call to schedule shipment and confirm address. They will mail your medication to your home.  Labs are due in 1 month then every 3 months. Lab hours are from Monday to Thursday 8am-12:30pm and 1pm-4pm and Friday 8am-12pm. You do not need an appointment if you come for labs during these times. If you'd like to go to a [facility] or [facility] closer to home, please call our clinic 48 hours prior to lab date so we can release orders in a timely manner.  Stay up to date on all routine vaccines: influenza, pneumonia, COVID19, Shingles  How to manage an injection site reaction: Remember the 5 C's: COUNTER - leave on the counter at least 30 minutes but up to overnight to bring medication to room temperature. This may help prevent stinging COLD - place something cold (like an ice gel pack or cold water bottle) on the injection site just before cleansing with alcohol. This may help reduce pain CLARITIN - use Claritin (generic name is loratadine) for the first two weeks of treatment or the day of, the day before, and the day after injecting. This will help to minimize injection site reactions CORTISONE CREAM - apply if injection site is irritated and itching CALL ME - if injection site reaction is bigger than the size of your fist, looks infected, blisters, or if you develop hives

## 2024-01-11 NOTE — Progress Notes (Unsigned)
 Pharmacy Note  Subjective:   Patient presents to clinic today to receive first dose of CIMZIA  for rheumatoid arthritis and sarcoidosis. Patient's last Avsola  infusion was 12/15/23. Plan to discontinue due to antibody development.  He has taken azathioprine  and mycophenolate  in the past.  Patient running a fever or have signs/symptoms of infection? No  Patient currently on antibiotics for the treatment of infection? No  Patient have any upcoming invasive procedures/surgeries? No  Objective: CMP     Component Value Date/Time   NA 135 12/15/2023 1325   K 3.7 12/15/2023 1325   CL 103 12/15/2023 1325   CO2 21 12/15/2023 1325   GLUCOSE 153 (H) 12/15/2023 1325   BUN 21 12/15/2023 1325   CREATININE 1.09 12/15/2023 1325   CALCIUM  9.8 12/15/2023 1325   PROT 7.1 12/15/2023 1325   ALBUMIN 4.8 05/18/2023 1400   AST 28 12/15/2023 1325   ALT 40 12/15/2023 1325   ALKPHOS 49 05/18/2023 1400   BILITOT 0.9 12/15/2023 1325   GFRNONAA 66 04/21/2020 1020   GFRAA 76 04/21/2020 1020    CBC    Component Value Date/Time   WBC 6.7 12/15/2023 1325   RBC 4.33 12/15/2023 1325   HGB 14.3 12/15/2023 1325   HCT 42.2 12/15/2023 1325   PLT 285 12/15/2023 1325   MCV 97.5 12/15/2023 1325   MCH 33.0 12/15/2023 1325   MCHC 33.9 12/15/2023 1325   RDW 12.3 12/15/2023 1325   LYMPHSABS 0.9 05/18/2023 1406   MONOABS 0.6 05/18/2023 1406   EOSABS 208 12/15/2023 1325   BASOSABS 40 12/15/2023 1325    Baseline Immunosuppressant Therapy Labs TB GOLD    Latest Ref Rng & Units 10/17/2023    4:30 PM  Quantiferon TB Gold  Quantiferon TB Gold Plus NEGATIVE NEGATIVE    Hepatitis Panel    Latest Ref Rng & Units 08/22/2016    9:20 AM  Hepatitis  Hep C Ab NEGATIVE NEGATIVE   Hepatitis panel negative on 05/20/2019 - [facility]  HIV Lab Results  Component Value Date   HIV Non Reactive 02/28/2023   HIV NONREACTIVE 08/22/2016   HIV NONREACTIVE 03/24/2015   Immunoglobulins   SPEP    Latest Ref Rng &  Units 12/15/2023    1:25 PM  Serum Protein Electrophoresis  Total Protein 6.1 - 8.1 g/dL 7.1    TPMT Lab Results  Component Value Date   TPMT 16 08/25/2022     Chest x-ray: 11/09/2018 Splenomegaly. Diffuse adenopathy. Could represent sarcoidosis or lymphoproliferative disorder.  Assessment/Plan:  Counseled patient that Cimzia  is a TNF blocking agent.  Counseled patient on purpose, proper use, and adverse effects of Cimzia .  Reviewed the most common adverse effects including infections, headache, and injection site reactions. Discussed that there is the possibility of an increased risk of malignancy including non-melanoma skin cancer but it is not well understood if this increased risk is due to the medication or the disease state.  Advised patient to get yearly dermatology exams due to risk of skin cancer.  Counseled patient that Cimzia  should be held prior to scheduled surgery.  Recommend annual influenza, PCV 15 or PCV20 or Pneumovax 23, and Shingrix as indicated.   Provided patient with medication education material and answered all questions.  Patient voiced understanding.  Patient consented to Cimzia .  Will upload consent into the media tab.  Reviewed storage instructions for Cimzia .    Reviewed importance of holding CIMZIA  with signs/symptoms of an infections, if antibiotics are prescribed to treat an active infection, and  with invasive procedures  Demonstrated proper injection technique with CIMZIA  Pre-Filled Syringe demo device  Patient able to demonstrate proper injection technique using the teach back method. Patient self injected in the right upper thigh and left upper thigh with:  Pharmacy Supplied Medication: Cimzia  2 x 200 mg/mL Prefilled Syringe  NDC: 49525-289-18 Lot: [id number] Expiration: 2026-DEC-11  Patient tolerated well.  Observed for 30 mins in office for adverse reaction. Patient denies itchiness and irritation at injection., No swelling or redness noted., and Reviewed  injection site reaction management with patient verbally and printed information for review in AVS.  Patient is to return in 1 month for labs and 6-8 weeks for follow-up appointment.  Standing orders for CBC/CMP placed.  TB gold will be monitored yearly.   Referral to [facility] placed today for yearly skin checks while on TNF inhibitor due to risk for non melanoma skin cancer  CIMZIA  approved through insurance. Rx sent to: [facility]: [phone] . Patient provided with pharmacy phone number and advised to call later this week to schedule shipment to home.  Patient will continue CIMZIA  400mg  subcut at Week 0 (administered in office today), Week 2, Week 4 then every 4 weeks thereafter.  Discontinue azathioprine  (confirmed with Dr. Jeannetta)  All questions encouraged and answered.  Instructed patient to call with any further questions or concerns.  Sherry Pennant, PharmD, MPH, BCPS, CPP Clinical Pharmacist (Rheumatology and Pulmonology)  Andriette KYM Cleaves [facility] PharmD Candidate Class of 2026   01/11/2024 9:19 AM

## 2024-01-12 NOTE — Progress Notes (Signed)
 Patinet is Cimzia  new start for seronegative RA with sarcoid. Patient's last Avsola  infusion was 12/15/23. Discontinuing due to antibody development.   Patient will continue CIMZIA  400mg  subcut at Week 0 (administered in office today), Week 2, Week 4 then every 4 weeks thereafter.   Discontinue azathioprine  (confirmed with Dr. Jeannetta)  Referral to [facility] placed today for yearly skin checks while on TNF inhibitor due to risk for non melanoma skin cancer   Sherry Pennant, PharmD, MPH, BCPS, CPP Clinical Pharmacist [facility])

## 2024-01-12 NOTE — Progress Notes (Signed)
 I discussed / reviewed the pharmacy note by Andriette Cleaves, PharmD Candidate, and I agree with the findings and plans as documented. All appropriate orders and prescriptions have been placed.  Supervising provider: Jeannetta Lonni ORN, MD  Sherry Pennant, PharmD, MPH, BCPS, CPP Clinical Pharmacist [facility])

## 2024-01-22 ENCOUNTER — Ambulatory Visit: Admitting: Internal Medicine

## 2024-01-29 ENCOUNTER — Other Ambulatory Visit: Payer: Self-pay

## 2024-02-01 ENCOUNTER — Telehealth: Payer: Self-pay

## 2024-02-01 ENCOUNTER — Other Ambulatory Visit (HOSPITAL_COMMUNITY): Payer: Self-pay

## 2024-02-01 ENCOUNTER — Other Ambulatory Visit: Payer: Self-pay

## 2024-02-01 NOTE — Telephone Encounter (Signed)
 Received notification from [facility] that pt still had a copay around $700 even after copay card payment. Attempted to contact pt to discuss, left VoiceMail requesting a return call. Direct office number provided.  [employer] hotline: [phone]

## 2024-02-02 NOTE — Telephone Encounter (Signed)
 Called pt and discussed, provided phone number and advised that he either discuss having additional funds added to his card (if his funds are already depleted) or request a manufacturer debit card if it is instead his insurance blocking copay card payment. I requested that he reach back out to us  if either of these options are a dead end and we would figure out some other way to keep him on therapy, possibly sustaining him on samples until the end of the year if needed. Pt verbalized understanding to all, will await f/u.

## 2024-02-05 ENCOUNTER — Other Ambulatory Visit (HOSPITAL_COMMUNITY): Payer: Self-pay

## 2024-02-07 ENCOUNTER — Other Ambulatory Visit: Payer: Self-pay

## 2024-02-07 NOTE — Progress Notes (Signed)
 Spoke with pt on 02/02/24 about reaching out to the copay card company to discuss options. Just spoke with Sonny at [facility] and she is aware that we are waiting for the pt to do his part before we can move forward.

## 2024-02-09 ENCOUNTER — Ambulatory Visit

## 2024-02-12 ENCOUNTER — Other Ambulatory Visit (HOSPITAL_COMMUNITY): Payer: Self-pay

## 2024-02-26 ENCOUNTER — Telehealth: Payer: Self-pay | Admitting: Pharmacist

## 2024-02-26 NOTE — Telephone Encounter (Signed)
 Patient switching to in-office Cimzia  due to coverage issues through pharmacy benefit. Reports no cost issues with infliximab  infusions.  Patient has already completed loading dose for Cimzia   Case created in [employer] portal  Case # [id number]  Sherry Pennant, PharmD, MPH, BCPS, CPP Clinical Pharmacist [facility])

## 2024-02-26 NOTE — Telephone Encounter (Signed)
 Medication Samples reserved in fridge for patient. Not yet logged out in binder  Drug name: Cimzia  200mg /mL PFS x 2 syringes NDC: [address]-19 Qty: 1 box (2 syringes) LOT: [id number] Exp.Date: 03/24/2024  Dosing instructions: Inject 400mg  into the skin every 4 weeks  Sherry Pennant, PharmD, MPH, BCPS, CPP Clinical Pharmacist [facility])

## 2024-02-26 NOTE — Telephone Encounter (Addendum)
 Medication Samples given to patient. Logged out in binder.   Drug name: Cimzia  200mg /mL PFS x 2 syringes NDC: [address]-19 Qty: 1 box (2 syringes) LOT: [id number] Exp.Date: 03/24/2024   Dosing instructions: Inject 400mg  into the skin every 4 weeks  Dis-enrolled from CR follow-up.  Sherry Pennant, PharmD, MPH, BCPS, CPP Clinical Pharmacist [facility])

## 2024-02-29 NOTE — Telephone Encounter (Addendum)
 Received [employer] verification of benefits. Patient has commercial [employer] PPO plan that is active through 04/24/2024. We are in-network. Cimzia  is covered with 0% co-insurance for medications and $30 office visit copay. Pre-certification is required. Patient has a $2000 out of pocket max and has met $0 towards this.  CPT codes 03627 and [phone] are valid and billable with 0% co-insurance.  To initiate pre-certification for [id number], call [phone]  Plans to take Cimzia  sample provided on 03/07/24. He can start in-office Cimzia  on or after 04/04/2024

## 2024-03-05 NOTE — Progress Notes (Signed)
 Office Visit Note  Patient: Eduardo Parker             Date of Birth: 22-Nov-1980           MRN: [id number]             PCP: Alvia Bring, DO Referring: Alvia Bring, DO Visit Date: 03/19/2024   Subjective:   Discussed the use of AI scribe software for clinical note transcription with the patient, who gave verbal consent to proceed.  History of Present Illness   Eduardo Parker is a 43 y.o. male here for follow up with sarcoidosis now on Cimzia  in office injcetions.  He has been on Cimzia  and notes significant improvement in symptoms, with no severe pain episodes that previously required urgent calls to the office. He mentions slight swelling in the knuckles before his last Cimzia  dose, but it was minor and not associated with significant redness or swelling.  He has a history of developing anti-drug antibodies with previous treatments like Avzola, which led to drug reactions. However, he has not experienced such reactions with Cimzia , which is structurally different and less likely to provoke antibody formation.  His skin rashes have remained stable without significant changes. No new cough or shortness of breath, but he mentions mild chest pressure and slight shortness of breath, which he does not find concerning.  He recalls past hypercalcemia related to sarcoidosis.      Previous HPI 12/11/2023 Eduardo Parker is a 43 year old male with sarcoidosis on Avsola  5 mg/kg every 8 weeks who presents with worsening joint pain and stiffness.   He experiences widespread joint pain and stiffness, initially starting in his feet, toes, and ankles, and progressing to his knees. He has difficulty standing and walking, often needing to 'push off of things' to stand and 'throw this leg with my hip' to walk. His knees are particularly affected, causing significant discomfort during movement.   There is swelling in his ankles and some swelling in his knees. Despite steroid treatment, there  have been no significant changes in his skin condition over the past two weeks. His joints remain swollen, especially the right third knuckle, which he reports is sometimes difficult to close into a fist.   No recent illness or fever is noted, but he experiences neck pain and difficulty swallowing, possibly due to swelling. Muscle stiffness and tenderness are present, particularly in his shoulders, which are difficult to move, especially at night. The pain sometimes disrupts his sleep, though he often wakes up feeling better, only for the pain to return later in the day.   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 Shoulder pain is exacerbated by movements such as reaching across his body or lifting his arm, making tasks like moving a pillow or steering a car challenging.   His last infusion provided significant relief, but the effects wore off more quickly than expected. He has not experienced pain until the end of June, and finds it unusual that his rashes have not worsened despite increased joint pain.   No constipation or muscle cramping is reported, which are common symptoms of hypercalcemia, a condition he has had before. He describes his joint pain as soreness rather than cramping.         Previous HPI 10/17/23 Eduardo Parker is a 43 y.o. male here for follow up for sarcoidosis with cutaneous and cervical and mediastinal lymphadenopathy involvement now on Avsola  infusion 3 mg/kg q8wks since January.  Symptoms were doing well but have been worsening including new complaint of joint pain and swelling for a few weeks.   He has been experiencing joint pain and swelling for the past two weeks, affecting his knuckles, wrist, elbow, and feet around the toes. The pain and swelling resolve in one area and then appear in another. He describes difficulty moving his elbow, such as bringing a cup to his mouth.   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 He is not sure if he overused joints, although he has done similar work in the past without joint swelling.   He was due for an Avsola  infusion last week or the week before, which he missed. His joint pain began before missing the infusion. Additionally, his rash has worsened since the last visit, which he associates with the joint swelling. He has not experienced joint pain or swelling prior to this recent episode.   Previous HPI 07/17/2023 Eduardo Parker is a 43 y.o. 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   He has been on Avsola  since January to manage his sarcoidosis. Since starting the treatment, his skin lesions, which were previously indurated and nodular, have flattened significantly, although they remain marked. The lesions are no longer raised, and there is no induration. He notes a significant improvement in his condition over the past couple of months.   His lymph nodes, which were previously enlarged, are now less noticeable. They are no longer visible from a distance, although he can still feel them. He has mediastinal adenopathy and skin involvement with sarcoidosis. No coronary symptoms are present.   He reports no adverse reactions to the Avsola  infusions, such as swelling, and has not noticed any significant changes or side effects from the infusions.   He mentions a recent cold that lingered longer than expected and a finger injury that took longer to heal, which he attributes to the medication. The finger was described as puffed up and required him to pop it and apply Neosporin for healing.   He has a history of high calcium  levels and elevated ACE levels, which were previously noted. He has tried other oral  medications, including Cellcept  and azathioprine , but they did not significantly impact his condition. Methotrexate was not tried due to his alcohol use. Previous eye exam with Dr. Maree negative for uveitis prior to treatment.     Previous HPI 04/10/2023 Eduardo Parker is a 43 y.o. 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  He tried increasing mycophenolate  to 1000 mg twice daily but despite taking this for the first month he saw no difference at all and rashes.  So he discontinued the medication.  Otherwise has not noticed any major change in symptoms.  Due to the genital rash he had STI screening in primary care office that was negative.  1 new lesion from a abrasion on his ankle that he noticed has been very slow to heal present for the past 2 weeks.   Previous HPI 02/07/2023 Eduardo Parker is a 43 y.o. 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  He started taking the mycophenolate  500 mg  twice daily for about 2 months so far has not seen any major difference in symptoms also no trouble taking the medicine.  Still with active rashes throughout his whole back.  No new swollen joints has been suffering some pain and soreness in his neck for at least the past 1 week.  He is not sure if it is related to the large lymph nodes. He is also noticed a new small erythematous rash on the edge of his glans.  No other lesions in his genital area and no palpable adenopathy.  Not associated with any dysuria, frequency, hematuria, or discharge.   Previous HPI 12/07/2022 Eduardo Parker is a 43 y.o. 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  He started on Imuran  has been taking 100 mg daily now for about 2 months.  So far has not seen any significant improvement in symptoms.  Not noticed any side  effect or problem with the medication either.  Still has extensive rashes throughout his back and palpable nontender nodules around the neck.   Previous HPI 08/25/22 Eduardo Parker is a 43 y.o. male here for sarcoidosis.  He was originally diagnosed with sarcoidosis after hospitalization for acute kidney injury with hypercalcemia and diffuse lymphadenopathy with granulomatous lymphadenitis findings on biopsy in July 2020.  Was also found to have some papular dermatitis involvement as well as left I inflammation with decreased visual acuity.  He was treated with a course of oral steroids with improvement of his lymphadenopathy symptoms. He saw Dr. Maree with findings indicative for inflammatory eye disease and was started on treatment with methotrexate but looks like he did not stay on the medication longer than 2 months.  He reports seeing no appreciable difference in symptoms when taking the medication.  He is pretty much had persistent painless lymph node swelling in multiple areas and decreased visual acuity in the left eye.  In the past few months skin rash has become more extensive now covering most of his back he was prescribed topical steroids for use but does not find is feasible over the large surface area have not seen a great response with the use so far.  He reports never having good exercise tolerance going back years but no particular cough or worsened dyspnea on exertion.  No joint pain or swelling.  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   HBV/HCV neg 04/2019   Review of Systems  Constitutional:  Positive for fatigue.  HENT:  Positive for mouth dryness. Negative for mouth sores.   Eyes:  Negative for dryness.  Respiratory:  Negative for shortness of breath.   Cardiovascular:  Negative for chest pain and palpitations.  Gastrointestinal:  Negative for blood in stool, constipation and diarrhea.   Endocrine: Negative for increased urination.  Genitourinary:  Negative for involuntary urination.  Musculoskeletal:  Negative for joint pain, gait problem, joint pain, joint swelling, myalgias, muscle weakness, morning stiffness, muscle tenderness and myalgias.  Skin:  Negative for color change, rash, hair loss and sensitivity to sunlight.  Allergic/Immunologic: Negative for susceptible to infections.  Neurological:  Negative for dizziness and headaches.  Hematological:  Negative for swollen glands.  Psychiatric/Behavioral:  Positive for depressed mood. Negative for sleep disturbance. The patient is nervous/anxious.     PMFS History:  Patient Active Problem List   Diagnosis Date Noted   Seronegative rheumatoid arthritis (HCC)  12/20/2023   Inflammatory arthritis 12/11/2023   History of infliximab  therapy 12/11/2023   Screening for tuberculosis 10/18/2023   Type 2 diabetes mellitus without complications (HCC) 06/22/2023   Penile lesion 02/28/2023   Rash of penis 02/07/2023   High risk medication use 12/07/2022   Finger numbness 06/17/2022   Vasectomy evaluation 11/12/2020   Cutaneous sarcoidosis (HCC) 08/10/2020   Essential hypertension 10/25/2019   Alcohol use disorder, moderate, dependence (HCC) 09/11/2019   Retinal edema 06/27/2019   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   Sarcoidosis of lymph nodes 11/15/2018   Constipation 11/11/2018   Family history of colon cancer 11/11/2018   Hypercalcemia 11/09/2018   Splenomegaly 11/09/2018   Mass of left side of neck 07/22/2018   Cervical lymphadenopathy 07/22/2018   Gastroesophageal reflux disease with esophagitis 07/12/2018   Deviated nasal septum 05/30/2017   Environmental allergies 05/30/2017   Dysthymia 05/30/2017   Elevated blood pressure reading 05/30/2017   Alcohol induced fatty liver 05/22/2017   Thiamine  deficiency 05/22/2017   Elevated ALT measurement 08/23/2016   Low libido 08/22/2016   Hypertriglyceridemia without  hypercholesterolemia 04/29/2015   Right groin mass 04/28/2015   Generalized anxiety disorder 03/24/2015    Past Medical History:  Diagnosis Date   Alcohol induced fatty liver 05/22/2017   Anxiety    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   Hypertriglyceridemia without hypercholesterolemia 04/29/2015   Type 2 diabetes mellitus with hyperglycemia, without long-term current use of insulin (HCC) 04/22/2020    Family History  Problem Relation Age of Onset   Breast cancer Mother    Depression Mother    Depression Father    Bipolar disorder Father    Colon cancer Paternal Grandfather    Past Surgical History:  Procedure Laterality Date   LYMPH NODE BIOPSY  2020   VASECTOMY     Social History   Social History Narrative   Not on file   Immunization History  Administered Date(s) Administered   Pneumococcal Polysaccharide-23 05/04/2020   Tdap 11/12/2020     Objective: Vital Signs: BP (!) 155/91   Pulse 73   Temp (!) 97.2 F (36.2 C)   Resp 16   Ht 6' 4 (1.93 m)   Wt 274 lb 12.8 oz (124.6 kg)   BMI 33.45 kg/m    Physical Exam Eyes:     Conjunctiva/sclera: Conjunctivae normal.  Cardiovascular:     Rate and Rhythm: Normal rate and regular rhythm.  Pulmonary:     Effort: Pulmonary effort is normal.     Breath sounds: Normal breath sounds.  Musculoskeletal:     Right lower leg: No edema.     Left lower leg: No edema.  Lymphadenopathy:     Cervical: No cervical adenopathy.  Skin:    General: Skin is warm and dry.     Findings: Rash present.     Comments: Erythematous patchy rash on back, mostly flat few slightly raised areas  Neurological:     Mental Status: He is alert.  Psychiatric:        Mood and Affect: Mood normal.      Musculoskeletal Exam:  Shoulders full ROM no tenderness or swelling Elbows full ROM no tenderness or swelling Wrists full ROM no tenderness or swelling Fingers full ROM no tenderness or swelling Knees full ROM no tenderness or  swelling Ankles full ROM no tenderness or swelling    Investigation: No additional findings.  Imaging: No results found.  Recent Labs: Lab Results  Component Value Date   WBC 5.9 03/19/2024  HGB 16.4 03/19/2024   PLT 237 03/19/2024   NA 137 03/19/2024   K 3.7 03/19/2024   CL 100 03/19/2024   CO2 25 03/19/2024   GLUCOSE 118 (H) 03/19/2024   BUN 19 03/19/2024   CREATININE 1.21 03/19/2024   BILITOT 0.9 03/19/2024   ALKPHOS 49 05/18/2023   AST 39 03/19/2024   ALT 56 (H) 03/19/2024   PROT 7.3 03/19/2024   ALBUMIN 4.8 05/18/2023   CALCIUM  10.1 03/19/2024   GFRAA 76 04/21/2020   QFTBGOLDPLUS NEGATIVE 10/17/2023    Speciality Comments: No specialty comments available.  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 ACE levels normalized, indicating effective disease control. Discussed ACE levels as a disease activity marker and Cimzia 's role in maintaining control without anti-drug antibody reactions. - Continue Cimzia  treatment 400 mg [place] monthly. - Recheck ACE levels and ESR to assess disease activity.  High risk medication use - Plan: CBC with Differential/Platelet, Comprehensive metabolic panel with GFR No serious interval infections.  Tolerating Cimzia  injection so far without reported local injection or action or other effects. - Checking CBC and CMP for medication monitoring on methotrexate     Orders: Orders Placed This Encounter  Procedures   Sedimentation rate   Angiotensin converting enzyme   CBC with Differential/Platelet   Comprehensive metabolic panel with GFR   No orders of the defined types were placed in this encounter.    Follow-Up Instructions: Return  in about 3 months (around 06/19/2024) for Sarcoid on CIM f/u 3mos.   Lonni LELON Ester, MD  Note - This record has been created using [employer].  Chart creation errors have been sought, but may not always  have been located. Such creation errors do not reflect on  the standard of medical care.

## 2024-03-06 NOTE — Telephone Encounter (Signed)
 Called [employer] to initiate pre-certification for [phone]. Per rep, medical pre-certs are not able to be completed over the phone. She is going to fax over the necessary forms, once completed and submitted the expected turnaround time is up to 72 business hours. We are currently awaiting reception of form.

## 2024-03-07 NOTE — Telephone Encounter (Signed)
 Cimzia  buy-and-bill pre-certification form faxed to [facility] with clinicals  Fax: [phone] Phone: [phone]   Sherry Pennant, PharmD, MPH, BCPS, CPP Clinical Pharmacist [facility])

## 2024-03-11 NOTE — Telephone Encounter (Signed)
 Patient scheduled for 04/10/2024 at 3:00 pm.

## 2024-03-11 NOTE — Telephone Encounter (Signed)
 Received notification from [facility] regarding a prior authorization for CIMZIA . Authorization has been APPROVED from 03/07/2024 to 03/07/2025. Approval letter sent to scan center.  Place of service: office Qty approved: 6000 units  Authorization # [id number]  Patient can be scheduled for MAINTENANCE Cimzia  dose on or after 04/04/2024  He has been added to Cimzia  savings portal. All Cimzia  admin and medication costs need to be process through savings program portal. Patient updated via Engineer, Maintenance to [facility] for scheduling  Sherry Pennant, PharmD, MPH, BCPS, CPP Clinical Pharmacist [facility])

## 2024-03-19 ENCOUNTER — Encounter: Payer: Self-pay | Admitting: Internal Medicine

## 2024-03-19 ENCOUNTER — Ambulatory Visit: Attending: Internal Medicine | Admitting: Internal Medicine

## 2024-03-19 VITALS — BP 155/91 | HR 73 | Temp 97.2°F | Resp 16 | Ht 76.0 in | Wt 274.8 lb

## 2024-03-19 DIAGNOSIS — M0600 Rheumatoid arthritis without rheumatoid factor, unspecified site: Secondary | ICD-10-CM

## 2024-03-19 DIAGNOSIS — D863 Sarcoidosis of skin: Secondary | ICD-10-CM | POA: Diagnosis not present

## 2024-03-19 DIAGNOSIS — Z79899 Other long term (current) drug therapy: Secondary | ICD-10-CM

## 2024-03-20 LAB — COMPREHENSIVE METABOLIC PANEL WITH GFR
AG Ratio: 1.4 (calc) (ref 1.0–2.5)
ALT: 56 U/L — ABNORMAL HIGH (ref 9–46)
AST: 39 U/L (ref 10–40)
Albumin: 4.3 g/dL (ref 3.6–5.1)
Alkaline phosphatase (APISO): 54 U/L (ref 36–130)
BUN: 19 mg/dL (ref 7–25)
CO2: 25 mmol/L (ref 20–32)
Calcium: 10.1 mg/dL (ref 8.6–10.3)
Chloride: 100 mmol/L (ref 98–110)
Creat: 1.21 mg/dL (ref 0.60–1.29)
Globulin: 3 g/dL (ref 1.9–3.7)
Glucose, Bld: 118 mg/dL — ABNORMAL HIGH (ref 65–99)
Potassium: 3.7 mmol/L (ref 3.5–5.3)
Sodium: 137 mmol/L (ref 135–146)
Total Bilirubin: 0.9 mg/dL (ref 0.2–1.2)
Total Protein: 7.3 g/dL (ref 6.1–8.1)
eGFR: 75 mL/min/1.73m2 (ref >=60)

## 2024-03-20 LAB — CBC WITH DIFFERENTIAL/PLATELET
Absolute Lymphocytes: 885 {cells}/uL (ref 850–3900)
Absolute Monocytes: 566 {cells}/uL (ref 200–950)
Basophils Absolute: 83 {cells}/uL (ref 0–200)
Basophils Relative: 1.4 %
Eosinophils Absolute: 372 {cells}/uL (ref 15–500)
Eosinophils Relative: 6.3 %
HCT: 46.9 % (ref 39.4–51.1)
Hemoglobin: 16.4 g/dL (ref 13.2–17.1)
MCH: 35.1 pg — ABNORMAL HIGH (ref 27.0–33.0)
MCHC: 35 g/dL (ref 31.6–35.4)
MCV: 100.4 fL (ref 81.4–101.7)
MPV: 10.8 fL (ref 7.5–12.5)
Monocytes Relative: 9.6 %
Neutro Abs: 3994 {cells}/uL (ref 1500–7800)
Neutrophils Relative %: 67.7 %
Platelets: 237 Thousand/uL (ref 140–400)
RBC: 4.67 Million/uL (ref 4.20–5.80)
RDW: 12.9 % (ref 11.0–15.0)
Total Lymphocyte: 15 %
WBC: 5.9 Thousand/uL (ref 3.8–10.8)

## 2024-03-20 LAB — ANGIOTENSIN CONVERTING ENZYME: Angiotensin-Converting Enzyme: 81 U/L — ABNORMAL HIGH (ref 9–67)

## 2024-03-20 LAB — SEDIMENTATION RATE: Sed Rate: 6 mm/h (ref 0–15)

## 2024-04-10 ENCOUNTER — Ambulatory Visit

## 2024-04-10 VITALS — BP 150/91 | HR 73

## 2024-04-10 DIAGNOSIS — M0600 Rheumatoid arthritis without rheumatoid factor, unspecified site: Secondary | ICD-10-CM

## 2024-04-10 MED ORDER — CERTOLIZUMAB PEGOL 2 X 200 MG ~~LOC~~ KIT
400.0000 mg | PACK | Freq: Once | SUBCUTANEOUS | Status: AC
  Administered 2024-04-10: 16:00:00 400 mg via SUBCUTANEOUS

## 2024-04-10 NOTE — Progress Notes (Signed)
 Subjective:   Patient presents to clinic today to receive monthly dose of Cimzia .  Patient running a fever or have signs/symptoms of infection? No  Patient currently on antibiotics for the treatment of infection? No  Patient have any upcoming invasive procedures/surgeries? No  Objective: CMP     Component Value Date/Time   NA 137 03/19/2024 1538   K 3.7 03/19/2024 1538   CL 100 03/19/2024 1538   CO2 25 03/19/2024 1538   GLUCOSE 118 (H) 03/19/2024 1538   BUN 19 03/19/2024 1538   CREATININE 1.21 03/19/2024 1538   CALCIUM  10.1 03/19/2024 1538   PROT 7.3 03/19/2024 1538   ALBUMIN 4.8 05/18/2023 1400   AST 39 03/19/2024 1538   ALT 56 (H) 03/19/2024 1538   ALKPHOS 49 05/18/2023 1400   BILITOT 0.9 03/19/2024 1538   GFRNONAA 66 04/21/2020 1020   GFRAA 76 04/21/2020 1020    CBC    Component Value Date/Time   WBC 5.9 03/19/2024 1538   RBC 4.67 03/19/2024 1538   HGB 16.4 03/19/2024 1538   HCT 46.9 03/19/2024 1538   PLT 237 03/19/2024 1538   MCV 100.4 03/19/2024 1538   MCH 35.1 (H) 03/19/2024 1538   MCHC 35.0 03/19/2024 1538   RDW 12.9 03/19/2024 1538   LYMPHSABS 0.9 05/18/2023 1406   MONOABS 0.6 05/18/2023 1406   EOSABS 372 03/19/2024 1538   BASOSABS 83 03/19/2024 1538    Baseline Immunosuppressant Therapy Labs TB GOLD    Latest Ref Rng & Units 10/17/2023    4:30 PM  Quantiferon TB Gold  Quantiferon TB Gold Plus NEGATIVE NEGATIVE    Hepatitis Panel    Latest Ref Rng & Units 08/22/2016    9:20 AM  Hepatitis  Hep C Ab NEGATIVE NEGATIVE    HIV Lab Results  Component Value Date   HIV Non Reactive 02/28/2023   HIV NONREACTIVE 08/22/2016   HIV NONREACTIVE 03/24/2015   Immunoglobulins   SPEP    Latest Ref Rng & Units 03/19/2024    3:38 PM  Serum Protein Electrophoresis  Total Protein 6.1 - 8.1 g/dL 7.3    H3EI No results found for: G6PDH TPMT Lab Results  Component Value Date   TPMT 16 08/25/2022     Chest x-ray: 05/13/2012  No acute  cardiopulmonary disease.   Assessment/Plan:   Administrations This Visit     certolizumab pegol  (CIMZIA ) kit 400 mg     Admin Date 04/10/2024 Action Given Dose 400 mg Route Subcutaneous Documented By Cena Alfonso CROME, LPN             Patient tolerated injection well.   Appointment for next injection scheduled for 05/08/2021.  Patient due for labs in February 2026.  Patient is to call and reschedule appointment if running a fever with signs/symptoms of infection, on antibiotics for active infection or has an upcoming invasive procedure.  All questions encouraged and answered.  Instructed patient to call with any further questions or concerns.

## 2024-04-14 ENCOUNTER — Other Ambulatory Visit: Payer: Self-pay | Admitting: Family Medicine

## 2024-04-14 DIAGNOSIS — F411 Generalized anxiety disorder: Secondary | ICD-10-CM

## 2024-05-03 ENCOUNTER — Telehealth: Payer: Self-pay | Admitting: Pharmacist

## 2024-05-03 NOTE — Telephone Encounter (Signed)
 Called patient to confirm no insurance changes in 2026. Unable to reach. Left VM. MyChart message  Sherry Pennant, PharmD, MPH, BCPS, CPP Clinical Pharmacist

## 2024-05-06 NOTE — Telephone Encounter (Signed)
 Pt returned call and confirmed that he has not changed insurances from last year to this year. Pt currently has authorization that is active through 03/07/2025. Nothing further needed at this time.

## 2024-05-06 NOTE — Telephone Encounter (Signed)
Attempted to contact pt to discuss, left VoiceMail requesting a return call. Direct office number provided.

## 2024-05-08 ENCOUNTER — Ambulatory Visit: Attending: Internal Medicine | Admitting: *Deleted

## 2024-05-08 VITALS — BP 148/84 | HR 64

## 2024-05-08 DIAGNOSIS — M0600 Rheumatoid arthritis without rheumatoid factor, unspecified site: Secondary | ICD-10-CM

## 2024-05-08 MED ORDER — CERTOLIZUMAB PEGOL 2 X 200 MG ~~LOC~~ KIT
400.0000 mg | PACK | Freq: Once | SUBCUTANEOUS | Status: AC
  Administered 2024-05-08: 400 mg via SUBCUTANEOUS

## 2024-05-08 NOTE — Progress Notes (Signed)
 Subjective:   Patient presents to clinic today to receive monthly dose of Cimzia .  Patient running a fever or have signs/symptoms of infection? No  Patient currently on antibiotics for the treatment of infection? No  Patient have any upcoming invasive procedures/surgeries? No  Objective: CMP     Component Value Date/Time   NA 137 03/19/2024 1538   K 3.7 03/19/2024 1538   CL 100 03/19/2024 1538   CO2 25 03/19/2024 1538   GLUCOSE 118 (H) 03/19/2024 1538   BUN 19 03/19/2024 1538   CREATININE 1.21 03/19/2024 1538   CALCIUM  10.1 03/19/2024 1538   PROT 7.3 03/19/2024 1538   ALBUMIN 4.8 05/18/2023 1400   AST 39 03/19/2024 1538   ALT 56 (H) 03/19/2024 1538   ALKPHOS 49 05/18/2023 1400   BILITOT 0.9 03/19/2024 1538   GFRNONAA 66 04/21/2020 1020   GFRAA 76 04/21/2020 1020    CBC    Component Value Date/Time   WBC 5.9 03/19/2024 1538   RBC 4.67 03/19/2024 1538   HGB 16.4 03/19/2024 1538   HCT 46.9 03/19/2024 1538   PLT 237 03/19/2024 1538   MCV 100.4 03/19/2024 1538   MCH 35.1 (H) 03/19/2024 1538   MCHC 35.0 03/19/2024 1538   RDW 12.9 03/19/2024 1538   LYMPHSABS 0.9 05/18/2023 1406   MONOABS 0.6 05/18/2023 1406   EOSABS 372 03/19/2024 1538   BASOSABS 83 03/19/2024 1538    Baseline Immunosuppressant Therapy Labs TB GOLD    Latest Ref Rng & Units 10/17/2023    4:30 PM  Quantiferon TB Gold  Quantiferon TB Gold Plus NEGATIVE NEGATIVE    Hepatitis Panel    Latest Ref Rng & Units 08/22/2016    9:20 AM  Hepatitis  Hep C Ab NEGATIVE NEGATIVE    HIV Lab Results  Component Value Date   HIV Non Reactive 02/28/2023   HIV NONREACTIVE 08/22/2016   HIV NONREACTIVE 03/24/2015   Immunoglobulins   SPEP    Latest Ref Rng & Units 03/19/2024    3:38 PM  Serum Protein Electrophoresis  Total Protein 6.1 - 8.1 g/dL 7.3    H3EI No results found for: G6PDH TPMT Lab Results  Component Value Date   TPMT 16 08/25/2022     Chest x-ray: 05/13/2012  No acute  cardiopulmonary disease.   Assessment/Plan:   Administrations This Visit     certolizumab pegol  (CIMZIA ) kit 400 mg     Admin Date 05/08/2024 Action Given Dose 400 mg Route Subcutaneous Documented By Cena Alfonso CROME, LPN             Patient tolerated injection well.   Appointment for next injection scheduled for 06/05/2024.  Patient due for labs in February 2026.  Patient is to call and reschedule appointment if running a fever with signs/symptoms of infection, on antibiotics for active infection or has an upcoming invasive procedure.  All questions encouraged and answered.  Instructed patient to call with any further questions or concerns.

## 2024-06-05 ENCOUNTER — Ambulatory Visit

## 2024-06-18 ENCOUNTER — Ambulatory Visit: Admitting: Internal Medicine

## 2024-07-09 ENCOUNTER — Encounter: Admitting: Family Medicine
